# Patient Record
Sex: Male | Born: 1975
Health system: Southern US, Community
[De-identification: ages and names within clinical notes are randomized; demographics above are authoritative.]

## PROBLEM LIST (undated history)

## (undated) DIAGNOSIS — R0789 Other chest pain: Secondary | ICD-10-CM

## (undated) DIAGNOSIS — S82892A Other fracture of left lower leg, initial encounter for closed fracture: Secondary | ICD-10-CM

## (undated) HISTORY — PX: NO PAST SURGERIES: SHX2092

## (undated) HISTORY — DX: Other chest pain: R07.89

---

## 2016-01-13 ENCOUNTER — Emergency Department (HOSPITAL_COMMUNITY)
Admission: EM | Admit: 2016-01-13 | Discharge: 2016-01-14 | Disposition: A | Discharge status: Home / Self Care | Payer: BLUE CROSS/BLUE SHIELD | Attending: Emergency Medicine | Admitting: Emergency Medicine

## 2016-01-13 ENCOUNTER — Emergency Department (HOSPITAL_COMMUNITY): Payer: BLUE CROSS/BLUE SHIELD

## 2016-01-13 ENCOUNTER — Encounter (HOSPITAL_COMMUNITY): Payer: Self-pay | Admitting: Emergency Medicine

## 2016-01-13 DIAGNOSIS — S92142A Displaced dome fracture of left talus, initial encounter for closed fracture: Secondary | ICD-10-CM | POA: Diagnosis not present

## 2016-01-13 DIAGNOSIS — S8252XA Displaced fracture of medial malleolus of left tibia, initial encounter for closed fracture: Secondary | ICD-10-CM | POA: Diagnosis not present

## 2016-01-13 DIAGNOSIS — S29001A Unspecified injury of muscle and tendon of front wall of thorax, initial encounter: Secondary | ICD-10-CM | POA: Insufficient documentation

## 2016-01-13 DIAGNOSIS — Y9389 Activity, other specified: Secondary | ICD-10-CM | POA: Insufficient documentation

## 2016-01-13 DIAGNOSIS — S99912A Unspecified injury of left ankle, initial encounter: Secondary | ICD-10-CM | POA: Diagnosis present

## 2016-01-13 DIAGNOSIS — S80212A Abrasion, left knee, initial encounter: Secondary | ICD-10-CM | POA: Insufficient documentation

## 2016-01-13 DIAGNOSIS — T1490XA Injury, unspecified, initial encounter: Secondary | ICD-10-CM

## 2016-01-13 DIAGNOSIS — F17200 Nicotine dependence, unspecified, uncomplicated: Secondary | ICD-10-CM | POA: Diagnosis not present

## 2016-01-13 DIAGNOSIS — Y998 Other external cause status: Secondary | ICD-10-CM | POA: Diagnosis not present

## 2016-01-13 DIAGNOSIS — S0001XA Abrasion of scalp, initial encounter: Secondary | ICD-10-CM | POA: Diagnosis not present

## 2016-01-13 DIAGNOSIS — S8262XA Displaced fracture of lateral malleolus of left fibula, initial encounter for closed fracture: Secondary | ICD-10-CM | POA: Diagnosis not present

## 2016-01-13 DIAGNOSIS — S82892A Other fracture of left lower leg, initial encounter for closed fracture: Secondary | ICD-10-CM

## 2016-01-13 DIAGNOSIS — Q7290 Unspecified reduction defect of unspecified lower limb: Secondary | ICD-10-CM

## 2016-01-13 DIAGNOSIS — Y92410 Unspecified street and highway as the place of occurrence of the external cause: Secondary | ICD-10-CM | POA: Diagnosis not present

## 2016-01-13 MED ORDER — PROPOFOL 10 MG/ML IV BOLUS
0.5000 mg/kg | Freq: Once | INTRAVENOUS | Status: AC
  Administered 2016-01-14: 41.3 mg via INTRAVENOUS

## 2016-01-13 MED ORDER — PROPOFOL 10 MG/ML IV BOLUS
INTRAVENOUS | Status: AC
  Filled 2016-01-13: qty 20

## 2016-01-13 MED ORDER — HYDROMORPHONE HCL 1 MG/ML IJ SOLN
1.0000 mg | Freq: Once | INTRAMUSCULAR | Status: AC
  Administered 2016-01-13: 1 mg via INTRAVENOUS
  Filled 2016-01-13: qty 1

## 2016-01-13 NOTE — ED Notes (Signed)
Ortho tech in to apply short leg splint.  Pt tolerated well.  Friends at bedside.  Warm blanket given
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
## 2016-01-14 LAB — BASIC METABOLIC PANEL
Anion gap: 11 (ref 5–15)
BUN: 10 mg/dL (ref 6–20)
CALCIUM: 9.6 mg/dL (ref 8.9–10.3)
CHLORIDE: 104 mmol/L (ref 101–111)
CO2: 26 mmol/L (ref 22–32)
CREATININE: 0.76 mg/dL (ref 0.61–1.24)
GFR calc non Af Amer: >60 mL/min (ref >60)
Glucose, Bld: 123 mg/dL — ABNORMAL HIGH (ref 65–99)
Potassium: 3.7 mmol/L (ref 3.5–5.1)
SODIUM: 141 mmol/L (ref 135–145)

## 2016-01-14 LAB — CBC WITH DIFFERENTIAL/PLATELET
BASOS PCT: 0 %
Basophils Absolute: 0 10*3/uL (ref 0.0–0.1)
EOS ABS: 0 10*3/uL (ref 0.0–0.7)
EOS PCT: 0 %
HCT: 45.8 % (ref 39.0–52.0)
Hemoglobin: 15.7 g/dL (ref 13.0–17.0)
LYMPHS ABS: 0.7 10*3/uL (ref 0.7–4.0)
Lymphocytes Relative: 6 %
MCH: 28.6 pg (ref 26.0–34.0)
MCHC: 34.3 g/dL (ref 30.0–36.0)
MCV: 83.6 fL (ref 78.0–100.0)
Monocytes Absolute: 0.7 10*3/uL (ref 0.1–1.0)
Monocytes Relative: 7 %
Neutro Abs: 9.7 10*3/uL — ABNORMAL HIGH (ref 1.7–7.7)
Neutrophils Relative %: 87 %
PLATELETS: 240 10*3/uL (ref 150–400)
RBC: 5.48 MIL/uL (ref 4.22–5.81)
RDW: 13.2 % (ref 11.5–15.5)
WBC: 11.1 10*3/uL — AB (ref 4.0–10.5)

## 2016-01-14 MED ORDER — HYDROCODONE-ACETAMINOPHEN 5-325 MG PO TABS: 1.0000 | ORAL_TABLET | ORAL | Status: DC | PRN

## 2016-01-14 MED ORDER — PROPOFOL 10 MG/ML IV BOLUS
INTRAVENOUS | Status: AC | PRN
  Administered 2016-01-14: 40 mg via INTRAVENOUS

## 2016-01-14 MED ORDER — TETANUS-DIPHTH-ACELL PERTUSSIS 5-2.5-18.5 LF-MCG/0.5 IM SUSP
0.5000 mL | Freq: Once | INTRAMUSCULAR | Status: AC
  Administered 2016-01-14: 0.5 mL via INTRAMUSCULAR
  Filled 2016-01-14: qty 0.5

## 2016-01-14 MED ORDER — HYDROCODONE-ACETAMINOPHEN 5-325 MG PO TABS
2.0000 | ORAL_TABLET | Freq: Once | ORAL | Status: AC
  Administered 2016-01-14: 2 via ORAL
  Filled 2016-01-14: qty 2

## 2016-01-14 MED ORDER — PROPOFOL 10 MG/ML IV BOLUS
INTRAVENOUS | Status: AC
  Filled 2016-01-14: qty 20

## 2016-01-14 NOTE — Discharge Instructions (Signed)
°Cast or Splint Care  ° ° °Casts and splints support injured limbs and keep bones from moving while they heal. It is important to care for your cast or splint at home.  °HOME CARE INSTRUCTIONS  °Keep the cast or splint uncovered during the drying period. It can take 24 to 48 hours to dry if it is made of plaster. A fiberglass cast will dry in less than 1 hour.  °Do not rest the cast on anything harder than a pillow for the first 24 hours.  °Do not put weight on your injured limb or apply pressure to the cast until your health care provider gives you permission.  °Keep the cast or splint dry. Wet casts or splints can lose their shape and may not support the limb as well. A wet cast that has lost its shape can also create harmful pressure on your skin when it dries. Also, wet skin can become infected.  °Cover the cast or splint with a plastic bag when bathing or when out in the rain or snow. If the cast is on the trunk of the body, take sponge baths until the cast is removed.  °If your cast does become wet, dry it with a towel or a blow dryer on the cool setting only. °Keep your cast or splint clean. Soiled casts may be wiped with a moistened cloth.  °Do not place any hard or soft foreign objects under your cast or splint, such as cotton, toilet paper, lotion, or powder.  °Do not try to scratch the skin under the cast with any object. The object could get stuck inside the cast. Also, scratching could lead to an infection. If itching is a problem, use a blow dryer on a cool setting to relieve discomfort.  °Do not trim or cut your cast or remove padding from inside of it.  °Exercise all joints next to the injury that are not immobilized by the cast or splint. For example, if you have a long leg cast, exercise the hip joint and toes. If you have an arm cast or splint, exercise the shoulder, elbow, thumb, and fingers.  °Elevate your injured arm or leg on 1 or 2 pillows for the first 1 to 3 days to decrease swelling and  pain. It is best if you can comfortably elevate your cast so it is higher than your heart. °SEEK MEDICAL CARE IF:  °Your cast or splint cracks.  °Your cast or splint is too tight or too loose.  °You have unbearable itching inside the cast.  °Your cast becomes wet or develops a soft spot or area.  °You have a bad smell coming from inside your cast.  °You get an object stuck under your cast.  °Your skin around the cast becomes red or raw.  °You have new pain or worsening pain after the cast has been applied. °SEEK IMMEDIATE MEDICAL CARE IF:  °You have fluid leaking through the cast.  °You are unable to move your fingers or toes.  °You have discolored (blue or white), cool, painful, or very swollen fingers or toes beyond the cast.  °You have tingling or numbness around the injured area.  °You have severe pain or pressure under the cast.  °You have any difficulty with your breathing or have shortness of breath.  °You have chest pain. °This information is not intended to replace advice given to you by your health care provider. Make sure you discuss any questions you have with your health care provider.  °  Document Released: 08/15/2000 Document Revised: 06/08/2013 Document Reviewed: 02/24/2013  °Elsevier Interactive Patient Education ©2016 [employer]  ° °

## 2016-01-16 ENCOUNTER — Other Ambulatory Visit: Payer: Self-pay | Admitting: Physician Assistant

## 2016-01-16 ENCOUNTER — Encounter (HOSPITAL_COMMUNITY): Payer: Self-pay | Admitting: *Deleted

## 2016-01-18 ENCOUNTER — Ambulatory Visit (HOSPITAL_COMMUNITY): Payer: BLUE CROSS/BLUE SHIELD | Admitting: Anesthesiology

## 2016-01-18 ENCOUNTER — Encounter (HOSPITAL_COMMUNITY)
Admission: RE | Disposition: A | Discharge status: Home / Self Care | Payer: Self-pay | Source: Ambulatory Visit | Attending: Orthopaedic Surgery

## 2016-01-18 ENCOUNTER — Ambulatory Visit (HOSPITAL_COMMUNITY): Payer: BLUE CROSS/BLUE SHIELD

## 2016-01-18 ENCOUNTER — Observation Stay (HOSPITAL_COMMUNITY): Payer: BLUE CROSS/BLUE SHIELD

## 2016-01-18 ENCOUNTER — Observation Stay (HOSPITAL_COMMUNITY)
Admission: RE | Admit: 2016-01-18 | Discharge: 2016-01-20 | Disposition: A | Discharge status: Home / Self Care | Payer: BLUE CROSS/BLUE SHIELD | Source: Ambulatory Visit | Attending: Orthopaedic Surgery | Admitting: Orthopaedic Surgery

## 2016-01-18 ENCOUNTER — Encounter (HOSPITAL_COMMUNITY): Payer: Self-pay | Admitting: *Deleted

## 2016-01-18 DIAGNOSIS — S82842A Displaced bimalleolar fracture of left lower leg, initial encounter for closed fracture: Secondary | ICD-10-CM | POA: Diagnosis not present

## 2016-01-18 DIAGNOSIS — F17210 Nicotine dependence, cigarettes, uncomplicated: Secondary | ICD-10-CM | POA: Diagnosis not present

## 2016-01-18 DIAGNOSIS — T148XXA Other injury of unspecified body region, initial encounter: Secondary | ICD-10-CM

## 2016-01-18 HISTORY — PX: ORIF ANKLE FRACTURE: SHX5408

## 2016-01-18 HISTORY — DX: Other fracture of left lower leg, initial encounter for closed fracture: S82.892A

## 2016-01-18 SURGERY — OPEN REDUCTION INTERNAL FIXATION (ORIF) ANKLE FRACTURE
Anesthesia: General | Site: Ankle | Laterality: Left

## 2016-01-18 MED ORDER — METHOCARBAMOL 500 MG PO TABS
500.0000 mg | ORAL_TABLET | Freq: Four times a day (QID) | ORAL | Status: DC | PRN
  Administered 2016-01-18 – 2016-01-20 (×3): 500 mg via ORAL
  Filled 2016-01-18 (×3): qty 1

## 2016-01-18 MED ORDER — HYDROMORPHONE HCL 1 MG/ML IJ SOLN
INTRAMUSCULAR | Status: AC
  Filled 2016-01-18: qty 1

## 2016-01-18 MED ORDER — METOCLOPRAMIDE HCL 5 MG/ML IJ SOLN: 5.0000 mg | Freq: Three times a day (TID) | INTRAMUSCULAR | Status: DC | PRN

## 2016-01-18 MED ORDER — BUPIVACAINE HCL (PF) 0.5 % IJ SOLN
INTRAMUSCULAR | Status: AC
  Filled 2016-01-18: qty 30

## 2016-01-18 MED ORDER — SODIUM CHLORIDE 0.9 % IV SOLN
INTRAVENOUS | Status: DC
  Administered 2016-01-18: 18:00:00 via INTRAVENOUS

## 2016-01-18 MED ORDER — ONDANSETRON HCL 4 MG PO TABS: 4.0000 mg | ORAL_TABLET | Freq: Four times a day (QID) | ORAL | Status: DC | PRN

## 2016-01-18 MED ORDER — CEFAZOLIN SODIUM-DEXTROSE 2-4 GM/100ML-% IV SOLN
INTRAVENOUS | Status: AC
  Filled 2016-01-18: qty 100

## 2016-01-18 MED ORDER — DIPHENHYDRAMINE HCL 12.5 MG/5ML PO ELIX: 12.5000 mg | ORAL_SOLUTION | ORAL | Status: DC | PRN

## 2016-01-18 MED ORDER — LIDOCAINE HCL (CARDIAC) 20 MG/ML IV SOLN
INTRAVENOUS | Status: AC
  Filled 2016-01-18: qty 5

## 2016-01-18 MED ORDER — CETYLPYRIDINIUM CHLORIDE 0.05 % MT LIQD
7.0000 mL | Freq: Two times a day (BID) | OROMUCOSAL | Status: DC
  Administered 2016-01-18 – 2016-01-20 (×3): 7 mL via OROMUCOSAL

## 2016-01-18 MED ORDER — HYDROMORPHONE HCL 1 MG/ML IJ SOLN
1.0000 mg | INTRAMUSCULAR | Status: DC | PRN
  Administered 2016-01-18: 1 mg via INTRAVENOUS
  Filled 2016-01-18: qty 1

## 2016-01-18 MED ORDER — FENTANYL CITRATE (PF) 250 MCG/5ML IJ SOLN
INTRAMUSCULAR | Status: AC
Start: 2016-01-18 — End: 2016-01-18
  Filled 2016-01-18: qty 5

## 2016-01-18 MED ORDER — LIDOCAINE HCL (CARDIAC) 20 MG/ML IV SOLN
INTRAVENOUS | Status: DC | PRN
  Administered 2016-01-18: 100 mg via INTRAVENOUS

## 2016-01-18 MED ORDER — MIDAZOLAM HCL 2 MG/2ML IJ SOLN
INTRAMUSCULAR | Status: AC
  Filled 2016-01-18: qty 2

## 2016-01-18 MED ORDER — SUCCINYLCHOLINE CHLORIDE 20 MG/ML IJ SOLN
INTRAMUSCULAR | Status: DC | PRN
  Administered 2016-01-18: 100 mg via INTRAVENOUS

## 2016-01-18 MED ORDER — HYDROMORPHONE HCL 1 MG/ML IJ SOLN
0.2500 mg | INTRAMUSCULAR | Status: DC | PRN
  Administered 2016-01-18 (×6): 0.5 mg via INTRAVENOUS

## 2016-01-18 MED ORDER — METHOCARBAMOL 1000 MG/10ML IJ SOLN
500.0000 mg | Freq: Four times a day (QID) | INTRAVENOUS | Status: DC | PRN
  Administered 2016-01-18: 500 mg via INTRAVENOUS
  Filled 2016-01-18: qty 5
  Filled 2016-01-18: qty 550

## 2016-01-18 MED ORDER — ACETAMINOPHEN 325 MG PO TABS
650.0000 mg | ORAL_TABLET | Freq: Four times a day (QID) | ORAL | Status: DC | PRN
  Administered 2016-01-18: 650 mg via ORAL
  Filled 2016-01-18: qty 2

## 2016-01-18 MED ORDER — METOCLOPRAMIDE HCL 5 MG PO TABS: 5.0000 mg | ORAL_TABLET | Freq: Three times a day (TID) | ORAL | Status: DC | PRN

## 2016-01-18 MED ORDER — CEFAZOLIN SODIUM-DEXTROSE 2-4 GM/100ML-% IV SOLN
2.0000 g | INTRAVENOUS | Status: AC
  Administered 2016-01-18: 2 g via INTRAVENOUS
  Filled 2016-01-18: qty 100

## 2016-01-18 MED ORDER — ONDANSETRON HCL 4 MG/2ML IJ SOLN
INTRAMUSCULAR | Status: DC | PRN
  Administered 2016-01-18: 4 mg via INTRAVENOUS

## 2016-01-18 MED ORDER — ACETAMINOPHEN 10 MG/ML IV SOLN
INTRAVENOUS | Status: DC | PRN
  Administered 2016-01-18: 1000 mg via INTRAVENOUS

## 2016-01-18 MED ORDER — ONDANSETRON HCL 4 MG/2ML IJ SOLN
INTRAMUSCULAR | Status: AC
  Filled 2016-01-18: qty 2

## 2016-01-18 MED ORDER — ONDANSETRON HCL 4 MG/2ML IJ SOLN: 4.0000 mg | Freq: Four times a day (QID) | INTRAMUSCULAR | Status: DC | PRN

## 2016-01-18 MED ORDER — ACETAMINOPHEN 10 MG/ML IV SOLN
INTRAVENOUS | Status: AC
  Filled 2016-01-18: qty 100

## 2016-01-18 MED ORDER — CHLORHEXIDINE GLUCONATE 4 % EX LIQD: 60.0000 mL | Freq: Once | CUTANEOUS | Status: DC

## 2016-01-18 MED ORDER — CEFAZOLIN SODIUM 1-5 GM-% IV SOLN
1.0000 g | Freq: Four times a day (QID) | INTRAVENOUS | Status: AC
  Administered 2016-01-18 – 2016-01-19 (×3): 1 g via INTRAVENOUS
  Filled 2016-01-18 (×3): qty 50

## 2016-01-18 MED ORDER — PROPOFOL 10 MG/ML IV BOLUS
INTRAVENOUS | Status: AC
  Filled 2016-01-18: qty 20

## 2016-01-18 MED ORDER — FENTANYL CITRATE (PF) 100 MCG/2ML IJ SOLN
INTRAMUSCULAR | Status: DC | PRN
  Administered 2016-01-18 (×5): 50 ug via INTRAVENOUS

## 2016-01-18 MED ORDER — MIDAZOLAM HCL 5 MG/5ML IJ SOLN
INTRAMUSCULAR | Status: DC | PRN
  Administered 2016-01-18 (×2): 1 mg via INTRAVENOUS

## 2016-01-18 MED ORDER — LACTATED RINGERS IV SOLN
INTRAVENOUS | Status: DC | PRN
  Administered 2016-01-18: 13:00:00 via INTRAVENOUS

## 2016-01-18 MED ORDER — PROPOFOL 10 MG/ML IV BOLUS
INTRAVENOUS | Status: DC | PRN
  Administered 2016-01-18: 180 mg via INTRAVENOUS

## 2016-01-18 MED ORDER — ACETAMINOPHEN 650 MG RE SUPP: 650.0000 mg | Freq: Four times a day (QID) | RECTAL | Status: DC | PRN

## 2016-01-18 MED ORDER — OXYCODONE HCL 5 MG PO TABS
5.0000 mg | ORAL_TABLET | ORAL | Status: DC | PRN
  Administered 2016-01-18 – 2016-01-20 (×5): 10 mg via ORAL
  Filled 2016-01-18 (×5): qty 2

## 2016-01-18 SURGICAL SUPPLY — 44 items
BANDAGE ELASTIC 4 VELCRO ST LF (GAUZE/BANDAGES/DRESSINGS) ×2 IMPLANT
CUFF TOURN SGL QUICK 34 (TOURNIQUET CUFF) ×1
CUFF TRNQT CYL 34X4X40X1 (TOURNIQUET CUFF) ×1 IMPLANT
DECANTER SPIKE VIAL GLASS SM (MISCELLANEOUS) ×2 IMPLANT
DRAPE C-ARM 42X120 X-RAY (DRAPES) ×2 IMPLANT
DRAPE U-SHAPE 47X51 STRL (DRAPES) ×2 IMPLANT
DRSG ADAPTIC 3X8 NADH LF (GAUZE/BANDAGES/DRESSINGS) IMPLANT
DRSG PAD ABDOMINAL 8X10 ST (GAUZE/BANDAGES/DRESSINGS) IMPLANT
DURAPREP 26ML APPLICATOR (WOUND CARE) ×2 IMPLANT
ELECT REM PT RETURN 9FT ADLT (ELECTROSURGICAL) ×2
ELECTRODE REM PT RTRN 9FT ADLT (ELECTROSURGICAL) ×1 IMPLANT
GAUZE SPONGE 4X4 12PLY STRL (GAUZE/BANDAGES/DRESSINGS) ×2 IMPLANT
GAUZE XEROFORM 5X9 LF (GAUZE/BANDAGES/DRESSINGS) ×2 IMPLANT
GLOVE BIO SURGEON STRL SZ7.5 (GLOVE) ×2 IMPLANT
GLOVE BIOGEL PI IND STRL 8 (GLOVE) ×2 IMPLANT
GLOVE BIOGEL PI INDICATOR 8 (GLOVE) ×2
GLOVE ECLIPSE 8.0 STRL XLNG CF (GLOVE) ×2 IMPLANT
GOWN STRL REUS W/TWL XL LVL3 (GOWN DISPOSABLE) ×4 IMPLANT
KIT BASIN OR (CUSTOM PROCEDURE TRAY) ×2 IMPLANT
NEEDLE HYPO 22GX1.5 SAFETY (NEEDLE) ×2 IMPLANT
NS IRRIG 1000ML POUR BTL (IV SOLUTION) ×2 IMPLANT
PACK TOTAL JOINT (CUSTOM PROCEDURE TRAY) ×2 IMPLANT
PAD ABD 8X10 STRL (GAUZE/BANDAGES/DRESSINGS) ×2 IMPLANT
PAD CAST 4YDX4 CTTN HI CHSV (CAST SUPPLIES) IMPLANT
PADDING CAST COTTON 4X4 STRL (CAST SUPPLIES)
PADDING CAST COTTON 6X4 STRL (CAST SUPPLIES) ×2 IMPLANT
PIN APEX 4X90MM EXFIX (EXFIX) ×4 IMPLANT
PIN APEX 5X150 (EXFIX) ×4 IMPLANT
PIN CLAMP 5H 30DEG POST (EXFIX) ×2 IMPLANT
PIN TO ROD COUPLING EXFIX (EXFIX) ×8 IMPLANT
PIN TO ROD COUPLING INVERT (EXFIX) ×8 IMPLANT
PIN TRANSFIXING EXFIX (EXFIX) ×2 IMPLANT
POSITIONER SURGICAL ARM (MISCELLANEOUS) ×2 IMPLANT
ROD CARBON (EXFIX) ×2
ROD CNCT 400X11XNS LF HFMN (EXFIX) ×2 IMPLANT
ROD HOFFMANN3 CONNECT 11X350 (EXFIX) ×4 IMPLANT
ROD SEMI CIRCULAR EXFIX (EXFIX) ×2 IMPLANT
ROD TO ROD COUPLING EXFIX (EXFIX) ×4 IMPLANT
SUT ETHILON 4 0 PS 2 18 (SUTURE) ×4 IMPLANT
SUT VIC AB 2-0 CT1 27 (SUTURE) ×1
SUT VIC AB 2-0 CT1 TAPERPNT 27 (SUTURE) ×1 IMPLANT
SYR CONTROL 10ML LL (SYRINGE) ×2 IMPLANT
TOWEL OR 17X26 10 PK STRL BLUE (TOWEL DISPOSABLE) ×4 IMPLANT
TOWEL OR NON WOVEN STRL DISP B (DISPOSABLE) ×2 IMPLANT

## 2016-01-18 NOTE — Anesthesia Procedure Notes (Signed)
Procedure Name: Intubation Date/Time: 01/18/2016 1:42 PM Performed by: Jarvis NewcomerARMISTEAD, Chiara Coltrin A Pre-anesthesia Checklist: Patient identified, Timeout performed, Emergency Drugs available, Suction available and Patient being monitored Patient Re-evaluated:Patient Re-evaluated prior to inductionOxygen Delivery Method: Circle system utilized Preoxygenation: Pre-oxygenation with 100% oxygen Intubation Type: IV induction Ventilation: Mask ventilation without difficulty Laryngoscope Size: Mac and 4 Grade View: Grade II Tube type: Oral Tube size: 7.5 mm Number of attempts: 2 Airway Equipment and Method: Stylet Placement Confirmation: breath sounds checked- equal and bilateral,  ETT inserted through vocal cords under direct vision and positive ETCO2 Secured at: 22 cm Tube secured with: Tape Dental Injury: Teeth and Oropharynx as per pre-operative assessment
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
## 2016-01-18 NOTE — Anesthesia Postprocedure Evaluation (Signed)
Anesthesia Post Note  Patient: Charles Hines  Procedure(s) Performed: Procedure(s) (LRB):  EXTERNAL FIXATION LEFT  UNSTABLE BIMALLEOLAR ANKLE FRACTURE (Left)  Patient location during evaluation: PACU Anesthesia Type: General Level of consciousness: awake Pain management: pain level controlled Vital Signs Assessment: post-procedure vital signs reviewed and stable Respiratory status: spontaneous breathing Cardiovascular status: stable Anesthetic complications: no    Last Vitals:  Filed Vitals:   01/18/16 1113  BP: 142/91  Pulse: 107  Temp: 37.2 C  Resp: 18    Last Pain:  Filed Vitals:   01/18/16 1146  PainSc: 3                  EDWARDS,Miroslava Santellan
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
## 2016-01-18 NOTE — Anesthesia Preprocedure Evaluation (Addendum)
Anesthesia Evaluation  Patient identified by MRN, date of birth, ID band Patient awake    Reviewed: Allergy & Precautions, NPO status , Patient's Chart, lab work & pertinent test results  Airway Mallampati: II  TM Distance: >3 FB Neck ROM: Full    Dental   Pulmonary Current Smoker,  breath sounds clear to auscultation        Cardiovascular negative cardio ROS  Rhythm:Regular Rate:Normal     Neuro/Psych    GI/Hepatic negative GI ROS, Neg liver ROS,   Endo/Other  negative endocrine ROS  Renal/GU negative Renal ROS     Musculoskeletal   Abdominal   Peds  Hematology   Anesthesia Other Findings   Reproductive/Obstetrics                             Anesthesia Physical Anesthesia Plan  ASA: II  Anesthesia Plan: General   Post-op Pain Management:    Induction: Intravenous  Airway Management Planned: Oral ETT  Additional Equipment:   Intra-op Plan:   Post-operative Plan: Extubation in OR  Informed Consent: I have reviewed the patients History and Physical, chart, labs and discussed the procedure including the risks, benefits and alternatives for the proposed anesthesia with the patient or authorized representative who has indicated his/her understanding and acceptance.   Dental advisory given  Plan Discussed with: CRNA and Anesthesiologist  Anesthesia Plan Comments:         Anesthesia Quick Evaluation  

## 2016-01-18 NOTE — Transfer of Care (Signed)
Immediate Anesthesia Transfer of Care Note  Patient: Charles Hines  Procedure(s) Performed: Procedure(s):  EXTERNAL FIXATION LEFT  UNSTABLE BIMALLEOLAR ANKLE FRACTURE (Left)  Patient Location: PACU  Anesthesia Type:General  Level of Consciousness: awake, alert , oriented and patient cooperative  Airway & Oxygen Therapy: Patient Spontanous Breathing and Patient connected to face mask oxygen  Post-op Assessment: Report given to RN, Post -op Vital signs reviewed and stable and Patient moving all extremities  Post vital signs: Reviewed and stable  Last Vitals:  Filed Vitals:   01/18/16 1113  BP: 142/91  Pulse: 107  Temp: 37.2 C  Resp: 18    Last Pain:  Filed Vitals:   01/18/16 1146  PainSc: 3       Patients Stated Pain Goal: 4 (01/18/16 1128)  Complications: No apparent anesthesia complications

## 2016-01-19 DIAGNOSIS — S82842A Displaced bimalleolar fracture of left lower leg, initial encounter for closed fracture: Secondary | ICD-10-CM | POA: Diagnosis not present

## 2016-01-19 MED ORDER — OXYCODONE-ACETAMINOPHEN 5-325 MG PO TABS: 1.0000 | ORAL_TABLET | ORAL | Status: DC | PRN

## 2016-01-19 MED ORDER — CEPHALEXIN 500 MG PO CAPS: 500.0000 mg | ORAL_CAPSULE | Freq: Two times a day (BID) | ORAL | Status: DC

## 2016-01-19 MED ORDER — ASPIRIN EC 325 MG PO TBEC: 325.0000 mg | DELAYED_RELEASE_TABLET | Freq: Two times a day (BID) | ORAL | Status: DC
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
## 2016-01-19 NOTE — Progress Notes (Signed)
Physical Therapy Treatment Patient Details Name: Charles Hines MRN: [id number] DOB: 1976/07/09 Today's Date: 01/19/2016    History of Present Illness s/p external fixation L bimalleolar ankle fx    PT Comments    Pt progressing; has not taken any pain meds since early am; pt reports pain has elevated this pm, RN notified for meds; may benefif  from staying another night to get pain controlled and work with PT in am;  Lengthy discussion regarding ice, elevation and  ex fix and how it is stabilizing the fxs; pt does well maintaining NWB  Follow Up Recommendations  No PT follow up     Equipment Recommendations  Rolling walker with 5" wheels    Recommendations for Other Services       Precautions / Restrictions Precautions Precautions: Fall Restrictions LLE Weight Bearing: Non weight bearing    Mobility  Bed Mobility                  Transfers Overall transfer level: Needs assistance Equipment used: Rolling walker (2 wheeled) Transfers: Sit to/from Stand Sit to Stand: Min guard;Supervision         General transfer comment: multi-modal cues for hand placement and safety  Ambulation/Gait Ambulation/Gait assistance: Min guard Ambulation Distance (Feet): 120 Feet (10' with crutches) Assistive device: Rolling walker (2 wheeled)       General Gait Details: cues for sequence and RW position   Stairs Stairs: Yes Stairs assistance: Min assist Stair Management: One rail Right;Step to pattern;With crutches Number of Stairs: 4 General stair comments: cues for sequence  Wheelchair Mobility    Modified Rankin (Stroke Patients Only)       Balance                                    Cognition Arousal/Alertness: Awake/alert Behavior During Therapy: WFL for tasks assessed/performed Overall Cognitive Status: Within Functional Limits for tasks assessed                      Exercises General Exercises - Lower Extremity Heel  Slides: 10 reps;AAROM;Left    General Comments        Pertinent Vitals/Pain Pain Assessment: 0-10 Pain Score: 7  Pain Location: L ankle Pain Descriptors / Indicators: Sore;Guarding;Grimacing Pain Intervention(s): Limited activity within patient's tolerance;Monitored during session;Patient requesting pain meds-RN notified    Home Living                      Prior Function            PT Goals (current goals can now be found in the care plan section) Acute Rehab PT Goals Patient Stated Goal: home soon PT Goal Formulation: With patient Time For Goal Achievement: 01/22/16 Potential to Achieve Goals: Good Progress towards PT goals: Progressing toward goals    Frequency  7X/week    PT Plan Current plan remains appropriate    Co-evaluation             End of Session Equipment Utilized During Treatment: Gait belt Activity Tolerance: Patient tolerated treatment well Patient left: with family/visitor present;in bed;with bed alarm set     Time: 1359-1454 PT Time Calculation (min) (ACUTE ONLY): 55 min  Charges:  $Gait Training: 38-52 mins $Therapeutic Activity: 8-22 mins                    G  Codes:  Functional Assessment Tool Used: clinical judgement Functional Limitation: Mobility: Walking and moving around Mobility: Walking and Moving Around Current Status [phone](G8978): At least 1 percent but less than 20 percent impaired, limited or restricted Mobility: Walking and Moving Around Goal Status [phone](G8979): At least 1 percent but less than 20 percent impaired, limited or restricted   [facility] HospitalWILLIAMS,Charles Hyden 01/19/2016, 3:59 PM

## 2016-01-19 NOTE — Evaluation (Signed)
Physical Therapy Evaluation Patient Details Name: Charles Hines MRN: [id number] DOB: 11/02/1975 Today's Date: 01/19/2016   History of Present Illness  s/p external fixation L bimalleolar ankle fx  Clinical Impression  Pt admitted with above diagnosis. Pt currently with functional limitations due to the deficits listed below (see PT Problem List). * Pt will benefit from skilled PT to increase their independence and safety with mobility to allow discharge to the venue listed below.   Will see for second session for stair training; (pt has been hopping up flight of stairs with friends assist)     Follow Up Recommendations No PT follow up    Equipment Recommendations  None recommended by PT    Recommendations for Other Services       Precautions / Restrictions Precautions Precautions: Fall Restrictions LLE Weight Bearing: Non weight bearing      Mobility  Bed Mobility Overal bed mobility: Needs Assistance Bed Mobility: Supine to Sit     Supine to sit: Min guard     General bed mobility comments: fro LLE  Transfers Overall transfer level: Needs assistance Equipment used: Rolling walker (2 wheeled) Transfers: Sit to/from Stand Sit to Stand: Min guard         General transfer comment: multi-modal cues for hand placement and safety  Ambulation/Gait Ambulation/Gait assistance: Min guard Ambulation Distance (Feet): 50 Feet Assistive device: Rolling walker (2 wheeled)       General Gait Details: cues for sequence and RW position  Stairs            Wheelchair Mobility    Modified Rankin (Stroke Patients Only)       Balance Overall balance assessment: Needs assistance   Sitting balance-Leahy Scale: Fair       Standing balance-Leahy Scale: Poor Standing balance comment: reliant on UEs                             Pertinent Vitals/Pain Pain Assessment: 0-10 Pain Score: 2  Pain Location: L ankle Pain Descriptors / Indicators:  Aching Pain Intervention(s): Limited activity within patient's tolerance;Monitored during session;Repositioned;Ice applied    Home Living Family/patient expects to be discharged to:: Private residence Living Arrangements: Non-relatives/Friends   Type of Home: House       Home Layout: Two level Home Equipment: Environmental consultantWalker - 2 wheels      Prior Function Level of Independence: Independent               Hand Dominance        Extremity/Trunk Assessment   Upper Extremity Assessment: Overall WFL for tasks assessed           Lower Extremity Assessment: LLE deficits/detail   LLE Deficits / Details: AAROM knee and hip grossly WFl; ankle NT--ex-fix     Communication   Communication: No difficulties  Cognition Arousal/Alertness: Awake/alert Behavior During Therapy: WFL for tasks assessed/performed Overall Cognitive Status: Within Functional Limits for tasks assessed                      General Comments      Exercises        Assessment/Plan    PT Assessment Patient needs continued PT services  PT Diagnosis Difficulty walking   PT Problem List Decreased activity tolerance;Decreased mobility;Decreased knowledge of precautions;Decreased knowledge of use of DME;Pain  PT Treatment Interventions DME instruction;Gait training;Stair training;Functional mobility training;Therapeutic activities;Therapeutic exercise;Patient/family education   PT Goals (Current goals can  be found in the Care Plan section) Acute Rehab PT Goals Patient Stated Goal: home sson PT Goal Formulation: With patient Time For Goal Achievement: 01/22/16 Potential to Achieve Goals: Good    Frequency 7X/week   Barriers to discharge        Co-evaluation               End of Session Equipment Utilized During Treatment: Gait belt Activity Tolerance: Patient tolerated treatment well Patient left: in chair;with call bell/phone within reach;with chair alarm set;with family/visitor  present           Time: 1610-9604 PT Time Calculation (min) (ACUTE ONLY): 25 min   Charges:   PT Evaluation $PT Eval Low Complexity: 1 Procedure PT Treatments $Gait Training: 8-22 mins   PT G Codes:        Reve Crocket 2016-01-21, 9:36 AM
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
## 2016-01-19 NOTE — Progress Notes (Signed)
Subjective: 1 Day Post-Op Procedure(s) (LRB):  EXTERNAL FIXATION LEFT  UNSTABLE BIMALLEOLAR ANKLE FRACTURE (Left) Patient reports pain as moderate.    Objective: Vital signs in last 24 hours: Temp:  [98.1 F (36.7 C)-101.9 F (38.8 C)] 99.6 F (37.6 C) (05/20 0617) Pulse Rate:  [63-107] 83 (05/20 0617) Resp:  [13-18] 16 (05/20 0617) BP: (141-182)/(82-107) 152/83 mmHg (05/20 0617) SpO2:  [98 %-100 %] 98 % (05/20 0617) Weight:  [75.751 kg (167 lb)] 75.751 kg (167 lb) (05/19 1145)  Intake/Output from previous day: 05/19 0701 - 05/20 0700 In: 3903.3 [P.O.:1680; I.V.:2223.3] Out: 1810 [Urine:1800; Blood:10] Intake/Output this shift:    No results for input(s): HGB in the last 72 hours. No results for input(s): WBC, RBC, HCT, PLT in the last 72 hours. No results for input(s): NA, K, CL, CO2, BUN, CREATININE, GLUCOSE, CALCIUM in the last 72 hours. No results for input(s): LABPT, INR in the last 72 hours.  Sensation intact distally Intact pulses distally Incision: dressing C/D/I Compartment soft  Assessment/Plan: 1 Day Post-Op Procedure(s) (LRB):  EXTERNAL FIXATION LEFT  UNSTABLE BIMALLEOLAR ANKLE FRACTURE (Left) Up with therapy - NWB left ankle Ice and elevation Discharge to home today if clears therapy vs tomorrow  Charles Hines,Charles Hines 01/19/2016, 10:29 AM

## 2016-01-19 NOTE — Discharge Instructions (Signed)
Keep all of your weight off of your left leg. Keep your dressings clean and dry. Ice and elevation as needed periodically throughout the day.

## 2016-01-20 DIAGNOSIS — S82842A Displaced bimalleolar fracture of left lower leg, initial encounter for closed fracture: Secondary | ICD-10-CM | POA: Diagnosis not present

## 2016-01-20 NOTE — Progress Notes (Signed)
Pt explained and given dc instructions; interpreter service utilized. Pt verbalized understanding. Pt safely placed in wheelchair for transport to lobby.  Pt escorted to lobby via nursing tech and accompanied by two friends. Discharging to home

## 2016-01-20 NOTE — Progress Notes (Signed)
Patient ID: Charles Hines, male   DOB: 05/10/1976, 40 y.o.   MRN: [id number] Patient ambulating independently in the hall with a walker nonweightbearing on the left lower extremity. Plan for discharge to home today. Prescriptions provided follow-up in the office with Dr. Magnus IvanBlackman next week.

## 2016-01-20 NOTE — Progress Notes (Signed)
Physical Therapy Treatment Patient Details Name: Charles Hines MRN: [id number] DOB: 1976-02-16 Today's Date: 01/20/2016    History of Present Illness s/p external fixation L bimalleolar ankle fx    PT Comments    Pt feels better today, pain controlled; pt and friend express readiness to go home; all questions answered; discussed proper elevation of LLE at home, continued awareness of exfix (size) during mobility  Follow Up Recommendations  No PT follow up     Equipment Recommendations  Rolling walker with 5" wheels    Recommendations for Other Services       Precautions / Restrictions Precautions Precautions: Fall Restrictions LLE Weight Bearing: Non weight bearing Other Position/Activity Restrictions: discussed LLE elevation without pressure on posterior bar, ice as needed    Mobility  Bed Mobility Overal bed mobility: Needs Assistance Bed Mobility: Sit to Supine       Sit to supine: Min guard   General bed mobility comments: for LLE  Transfers Overall transfer level: Modified independent Equipment used: Rolling walker (2 wheeled) Transfers: Sit to/from Stand Sit to Stand: Modified independent (Device/Increase time)         General transfer comment: pt self corrects for hand placement  Ambulation/Gait Ambulation/Gait assistance: Supervision;Min guard Ambulation Distance (Feet): 130 Feet Assistive device: Rolling walker (2 wheeled)       General Gait Details: cues for RW distance from self   Stairs         General stair comments:  (verbalizes correct technique)  Wheelchair Mobility    Modified Rankin (Stroke Patients Only)       Balance             Standing balance-Leahy Scale: Poor Standing balance comment: reliant on UEs for safe standing                    Cognition Arousal/Alertness: Awake/alert Behavior During Therapy: WFL for tasks assessed/performed Overall Cognitive Status: Within Functional Limits for tasks  assessed                      Exercises      General Comments        Pertinent Vitals/Pain Pain Assessment: 0-10 Pain Score: 2  Pain Location: L ankle Pain Descriptors / Indicators: Pressure Pain Intervention(s): Limited activity within patient's tolerance;Monitored during session;Other (comment) (LLE elevation)    Home Living                      Prior Function            PT Goals (current goals can now be found in the care plan section) Acute Rehab PT Goals Patient Stated Goal: home today PT Goal Formulation: With patient Time For Goal Achievement: 01/22/16 Potential to Achieve Goals: Good Progress towards PT goals: Progressing toward goals    Frequency       PT Plan Current plan remains appropriate    Co-evaluation             End of Session   Activity Tolerance: Patient tolerated treatment well Patient left: with family/visitor present;in bed;with bed alarm set     Time: 4098-1191 PT Time Calculation (min) (ACUTE ONLY): 23 min  Charges:  $Gait Training: 23-37 mins                    G Codes:  Functional Assessment Tool Used: clinical judgement Mobility: Walking and Moving Around Goal Status (Y7829): At least 1 percent  but less than 20 percent impaired, limited or restricted Mobility: Walking and Moving Around Discharge Status [phone]([id number]): At least 1 percent but less than 20 percent impaired, limited or restricted   [facility]WILLIAMS,Charles Rini 01/20/2016, 10:17 AM

## 2016-01-20 NOTE — Care Management Note (Signed)
Case Management Note  Patient Details  Name: Antonieta Lovelessbdalhakam Flannigan MRN: [id number] Date of Birth: 04/18/1976  Subjective/Objective:                  s/p external fixation L bimalleolar ankle fx  Action/Plan: CM spoke with patient. Patient needs a RW for home. [facility] request and d/c date of today and will deliver the RW to patient's room.   Expected Discharge Date:   01/20/16               Expected Discharge Plan:  Home/Self Care  In-House Referral:     Discharge planning Services  CM Consult  Post Acute Care Choice:    Choice offered to:     DME Arranged:  Walker rolling DME Agency:  [facility]  [facility]:     Status of Service:  Completed, signed off  Medicare Important Message Given:    Date Medicare IM Given:    Medicare IM give by:    Date Additional Medicare IM Given:    Additional Medicare Important Message give by:     If discussed at Long Length of Stay Meetings, dates discussed:    Additional Comments:  Antony HasteBennett, Kallum Jorgensen Harris, RN 01/20/2016, 9:07 AM

## 2016-01-21 ENCOUNTER — Encounter (HOSPITAL_COMMUNITY): Payer: Self-pay | Admitting: Orthopaedic Surgery

## 2016-01-24 ENCOUNTER — Encounter (HOSPITAL_COMMUNITY): Payer: Self-pay | Admitting: *Deleted

## 2016-01-24 ENCOUNTER — Other Ambulatory Visit: Payer: Self-pay | Admitting: Physician Assistant

## 2016-02-01 ENCOUNTER — Encounter (HOSPITAL_COMMUNITY): Payer: Self-pay | Admitting: *Deleted

## 2016-02-01 ENCOUNTER — Ambulatory Visit (HOSPITAL_COMMUNITY): Payer: BLUE CROSS/BLUE SHIELD

## 2016-02-01 ENCOUNTER — Ambulatory Visit (HOSPITAL_COMMUNITY): Payer: BLUE CROSS/BLUE SHIELD | Admitting: Anesthesiology

## 2016-02-01 ENCOUNTER — Ambulatory Visit (HOSPITAL_COMMUNITY)
Admission: RE | Admit: 2016-02-01 | Discharge: 2016-02-02 | Disposition: A | Discharge status: Home / Self Care | Payer: BLUE CROSS/BLUE SHIELD | Source: Ambulatory Visit | Attending: Orthopaedic Surgery | Admitting: Orthopaedic Surgery

## 2016-02-01 ENCOUNTER — Encounter (HOSPITAL_COMMUNITY)
Admission: RE | Disposition: A | Discharge status: Home / Self Care | Payer: Self-pay | Source: Ambulatory Visit | Attending: Orthopaedic Surgery

## 2016-02-01 DIAGNOSIS — Z87891 Personal history of nicotine dependence: Secondary | ICD-10-CM | POA: Diagnosis not present

## 2016-02-01 DIAGNOSIS — S82842A Displaced bimalleolar fracture of left lower leg, initial encounter for closed fracture: Secondary | ICD-10-CM | POA: Diagnosis present

## 2016-02-01 DIAGNOSIS — S82843A Displaced bimalleolar fracture of unspecified lower leg, initial encounter for closed fracture: Secondary | ICD-10-CM | POA: Diagnosis present

## 2016-02-01 DIAGNOSIS — S9305XA Dislocation of left ankle joint, initial encounter: Secondary | ICD-10-CM | POA: Diagnosis not present

## 2016-02-01 DIAGNOSIS — Z419 Encounter for procedure for purposes other than remedying health state, unspecified: Secondary | ICD-10-CM

## 2016-02-01 HISTORY — PX: ORIF ANKLE FRACTURE: SHX5408

## 2016-02-01 LAB — CBC
HCT: 37.9 % — ABNORMAL LOW (ref 39.0–52.0)
HEMOGLOBIN: 12.9 g/dL — AB (ref 13.0–17.0)
MCH: 28.1 pg (ref 26.0–34.0)
MCHC: 34 g/dL (ref 30.0–36.0)
MCV: 82.6 fL (ref 78.0–100.0)
PLATELETS: 414 10*3/uL — AB (ref 150–400)
RBC: 4.59 MIL/uL (ref 4.22–5.81)
RDW: 13.2 % (ref 11.5–15.5)
WBC: 3.8 10*3/uL — ABNORMAL LOW (ref 4.0–10.5)

## 2016-02-01 SURGERY — OPEN REDUCTION INTERNAL FIXATION (ORIF) ANKLE FRACTURE
Anesthesia: General | Site: Leg Lower | Laterality: Left

## 2016-02-01 MED ORDER — FENTANYL CITRATE (PF) 100 MCG/2ML IJ SOLN
INTRAMUSCULAR | Status: DC | PRN
  Administered 2016-02-01: 25 ug via INTRAVENOUS
  Administered 2016-02-01: 50 ug via INTRAVENOUS
  Administered 2016-02-01 (×3): 25 ug via INTRAVENOUS

## 2016-02-01 MED ORDER — FENTANYL CITRATE (PF) 100 MCG/2ML IJ SOLN
50.0000 ug | Freq: Once | INTRAMUSCULAR | Status: AC
  Administered 2016-02-01: 50 ug via INTRAVENOUS

## 2016-02-01 MED ORDER — SODIUM CHLORIDE 0.9 % IR SOLN
Status: AC
  Filled 2016-02-01: qty 1

## 2016-02-01 MED ORDER — ONDANSETRON HCL 4 MG/2ML IJ SOLN: 4.0000 mg | Freq: Four times a day (QID) | INTRAMUSCULAR | Status: DC | PRN

## 2016-02-01 MED ORDER — ACETAMINOPHEN 325 MG PO TABS
650.0000 mg | ORAL_TABLET | Freq: Four times a day (QID) | ORAL | Status: DC | PRN
  Administered 2016-02-02: 650 mg via ORAL
  Filled 2016-02-01: qty 2

## 2016-02-01 MED ORDER — OXYCODONE HCL 5 MG PO TABS
5.0000 mg | ORAL_TABLET | ORAL | Status: DC | PRN
  Administered 2016-02-02 (×2): 10 mg via ORAL
  Filled 2016-02-01 (×2): qty 2

## 2016-02-01 MED ORDER — CEFAZOLIN SODIUM 1-5 GM-% IV SOLN
1.0000 g | Freq: Four times a day (QID) | INTRAVENOUS | Status: AC
  Administered 2016-02-01 – 2016-02-02 (×3): 1 g via INTRAVENOUS
  Filled 2016-02-01 (×3): qty 50

## 2016-02-01 MED ORDER — MEPERIDINE HCL 50 MG/ML IJ SOLN: 6.2500 mg | INTRAMUSCULAR | Status: DC | PRN

## 2016-02-01 MED ORDER — SODIUM CHLORIDE 0.9 % IR SOLN
Status: DC | PRN
  Administered 2016-02-01: 500 mL

## 2016-02-01 MED ORDER — LIDOCAINE 2% (20 MG/ML) 5 ML SYRINGE
INTRAMUSCULAR | Status: DC | PRN
  Administered 2016-02-01: 60 mg via INTRAVENOUS

## 2016-02-01 MED ORDER — PROPOFOL 10 MG/ML IV BOLUS
INTRAVENOUS | Status: AC
  Filled 2016-02-01: qty 20

## 2016-02-01 MED ORDER — FENTANYL CITRATE (PF) 100 MCG/2ML IJ SOLN
INTRAMUSCULAR | Status: AC
  Filled 2016-02-01: qty 2

## 2016-02-01 MED ORDER — ONDANSETRON HCL 4 MG/2ML IJ SOLN
INTRAMUSCULAR | Status: DC | PRN
  Administered 2016-02-01: 4 mg via INTRAVENOUS

## 2016-02-01 MED ORDER — CEFAZOLIN SODIUM-DEXTROSE 2-4 GM/100ML-% IV SOLN
2.0000 g | INTRAVENOUS | Status: AC
  Administered 2016-02-01: 2 g via INTRAVENOUS

## 2016-02-01 MED ORDER — MIDAZOLAM HCL 2 MG/2ML IJ SOLN
INTRAMUSCULAR | Status: AC
  Filled 2016-02-01: qty 2

## 2016-02-01 MED ORDER — ONDANSETRON HCL 4 MG PO TABS: 4.0000 mg | ORAL_TABLET | Freq: Four times a day (QID) | ORAL | Status: DC | PRN

## 2016-02-01 MED ORDER — LACTATED RINGERS IV SOLN
INTRAVENOUS | Status: DC
  Administered 2016-02-01 (×2): via INTRAVENOUS

## 2016-02-01 MED ORDER — ASPIRIN EC 325 MG PO TBEC
325.0000 mg | DELAYED_RELEASE_TABLET | Freq: Two times a day (BID) | ORAL | Status: DC
  Administered 2016-02-02: 325 mg via ORAL
  Filled 2016-02-01 (×4): qty 1

## 2016-02-01 MED ORDER — ACETAMINOPHEN 650 MG RE SUPP: 650.0000 mg | Freq: Four times a day (QID) | RECTAL | Status: DC | PRN

## 2016-02-01 MED ORDER — METHOCARBAMOL 1000 MG/10ML IJ SOLN
500.0000 mg | Freq: Four times a day (QID) | INTRAMUSCULAR | Status: DC | PRN
  Administered 2016-02-01: 500 mg via INTRAVENOUS
  Filled 2016-02-01: qty 5
  Filled 2016-02-01: qty 550

## 2016-02-01 MED ORDER — MIDAZOLAM HCL 2 MG/2ML IJ SOLN
INTRAMUSCULAR | Status: AC
Start: 2016-02-01 — End: 2016-02-01
  Filled 2016-02-01: qty 2

## 2016-02-01 MED ORDER — METOCLOPRAMIDE HCL 5 MG/ML IJ SOLN: 5.0000 mg | Freq: Three times a day (TID) | INTRAMUSCULAR | Status: DC | PRN

## 2016-02-01 MED ORDER — HYDROMORPHONE HCL 1 MG/ML IJ SOLN
1.0000 mg | INTRAMUSCULAR | Status: DC | PRN
  Administered 2016-02-02: 1 mg via INTRAVENOUS
  Filled 2016-02-01: qty 1

## 2016-02-01 MED ORDER — DIPHENHYDRAMINE HCL 12.5 MG/5ML PO ELIX: 12.5000 mg | ORAL_SOLUTION | ORAL | Status: DC | PRN

## 2016-02-01 MED ORDER — SODIUM CHLORIDE 0.9 % IV SOLN
INTRAVENOUS | Status: DC
  Administered 2016-02-01: 23:00:00 via INTRAVENOUS

## 2016-02-01 MED ORDER — LIDOCAINE HCL (CARDIAC) 20 MG/ML IV SOLN
INTRAVENOUS | Status: AC
  Filled 2016-02-01: qty 5

## 2016-02-01 MED ORDER — ONDANSETRON HCL 4 MG/2ML IJ SOLN
INTRAMUSCULAR | Status: AC
  Filled 2016-02-01: qty 2

## 2016-02-01 MED ORDER — CEFAZOLIN SODIUM-DEXTROSE 2-4 GM/100ML-% IV SOLN
INTRAVENOUS | Status: AC
  Filled 2016-02-01: qty 100

## 2016-02-01 MED ORDER — METHOCARBAMOL 500 MG PO TABS
500.0000 mg | ORAL_TABLET | Freq: Four times a day (QID) | ORAL | Status: DC | PRN
  Administered 2016-02-02: 500 mg via ORAL
  Filled 2016-02-01: qty 1

## 2016-02-01 MED ORDER — METOCLOPRAMIDE HCL 10 MG PO TABS: 5.0000 mg | ORAL_TABLET | Freq: Three times a day (TID) | ORAL | Status: DC | PRN

## 2016-02-01 MED ORDER — BUPIVACAINE-EPINEPHRINE (PF) 0.5% -1:200000 IJ SOLN
INTRAMUSCULAR | Status: DC | PRN
  Administered 2016-02-01 (×2): 20 mL via PERINEURAL

## 2016-02-01 MED ORDER — BUPIVACAINE HCL (PF) 0.5 % IJ SOLN
INTRAMUSCULAR | Status: AC
  Filled 2016-02-01: qty 30

## 2016-02-01 MED ORDER — DEXAMETHASONE SODIUM PHOSPHATE 10 MG/ML IJ SOLN
INTRAMUSCULAR | Status: AC
  Filled 2016-02-01: qty 1

## 2016-02-01 MED ORDER — MIDAZOLAM HCL 2 MG/2ML IJ SOLN
2.0000 mg | Freq: Once | INTRAMUSCULAR | Status: AC
  Administered 2016-02-01: 2 mg via INTRAVENOUS

## 2016-02-01 MED ORDER — DEXAMETHASONE SODIUM PHOSPHATE 10 MG/ML IJ SOLN
INTRAMUSCULAR | Status: DC | PRN
  Administered 2016-02-01: 10 mg via INTRAVENOUS

## 2016-02-01 MED ORDER — PROPOFOL 10 MG/ML IV BOLUS
INTRAVENOUS | Status: DC | PRN
  Administered 2016-02-01: 170 mg via INTRAVENOUS

## 2016-02-01 MED ORDER — PROMETHAZINE HCL 25 MG/ML IJ SOLN: 6.2500 mg | INTRAMUSCULAR | Status: DC | PRN

## 2016-02-01 MED ORDER — FENTANYL CITRATE (PF) 250 MCG/5ML IJ SOLN
INTRAMUSCULAR | Status: AC
  Filled 2016-02-01: qty 5

## 2016-02-01 MED ORDER — HYDROMORPHONE HCL 1 MG/ML IJ SOLN: 0.2500 mg | INTRAMUSCULAR | Status: DC | PRN

## 2016-02-01 MED ORDER — CHLORHEXIDINE GLUCONATE 4 % EX LIQD: 60.0000 mL | Freq: Once | CUTANEOUS | Status: DC

## 2016-02-01 MED ORDER — LACTATED RINGERS IV SOLN: INTRAVENOUS | Status: DC

## 2016-02-01 MED ORDER — BUPIVACAINE-EPINEPHRINE (PF) 0.5% -1:200000 IJ SOLN
INTRAMUSCULAR | Status: AC
  Filled 2016-02-01: qty 60

## 2016-02-01 SURGICAL SUPPLY — 57 items
BANDAGE ACE 4X5 VEL STRL LF (GAUZE/BANDAGES/DRESSINGS) ×2 IMPLANT
BANDAGE ACE 6X5 VEL STRL LF (GAUZE/BANDAGES/DRESSINGS) ×2 IMPLANT
BIT DRILL CANN 2.7 (BIT) ×1
BIT DRILL SRG 2.7XCANN AO CPLG (BIT) ×1 IMPLANT
BIT DRL SRG 2.7XCANN AO CPLNG (BIT) ×1
CUFF TOURN SGL QUICK 34 (TOURNIQUET CUFF) ×1
CUFF TRNQT CYL 34X4X40X1 (TOURNIQUET CUFF) ×1 IMPLANT
DECANTER SPIKE VIAL GLASS SM (MISCELLANEOUS) ×2 IMPLANT
DRAPE C-ARM 42X120 X-RAY (DRAPES) ×2 IMPLANT
DRAPE U-SHAPE 47X51 STRL (DRAPES) ×2 IMPLANT
DRILL 2.6X122MM WL AO SHAFT (BIT) ×2 IMPLANT
DRSG PAD ABDOMINAL 8X10 ST (GAUZE/BANDAGES/DRESSINGS) ×4 IMPLANT
DURAPREP 26ML APPLICATOR (WOUND CARE) ×2 IMPLANT
ELECT REM PT RETURN 9FT ADLT (ELECTROSURGICAL) ×2
ELECTRODE REM PT RTRN 9FT ADLT (ELECTROSURGICAL) ×1 IMPLANT
GAUZE SPONGE 4X4 12PLY STRL (GAUZE/BANDAGES/DRESSINGS) ×2 IMPLANT
GAUZE XEROFORM 5X9 LF (GAUZE/BANDAGES/DRESSINGS) ×2 IMPLANT
GLOVE BIO SURGEON STRL SZ7.5 (GLOVE) ×2 IMPLANT
GLOVE BIOGEL PI IND STRL 7.0 (GLOVE) ×1 IMPLANT
GLOVE BIOGEL PI IND STRL 8 (GLOVE) ×2 IMPLANT
GLOVE BIOGEL PI INDICATOR 7.0 (GLOVE) ×1
GLOVE BIOGEL PI INDICATOR 8 (GLOVE) ×2
GLOVE ECLIPSE 8.0 STRL XLNG CF (GLOVE) ×2 IMPLANT
GLOVE SS PI 9.0 STRL (GLOVE) ×4 IMPLANT
GLOVE SURG SS PI 7.0 STRL IVOR (GLOVE) ×2 IMPLANT
GOWN SPEC L3 XXLG W/TWL (GOWN DISPOSABLE) ×2 IMPLANT
GOWN STRL REUS W/TWL LRG LVL3 (GOWN DISPOSABLE) ×2 IMPLANT
GOWN STRL REUS W/TWL XL LVL3 (GOWN DISPOSABLE) ×4 IMPLANT
K-WIRE 1.4X100 (WIRE) ×2
K-WIRE ORTHOPEDIC 1.4X150L (WIRE) ×4
KIT BASIN OR (CUSTOM PROCEDURE TRAY) ×2 IMPLANT
KWIRE 1.4X100 (WIRE) ×1 IMPLANT
KWIRE ORTHOPEDIC 1.4X150L (WIRE) ×2 IMPLANT
NEEDLE HYPO 22GX1.5 SAFETY (NEEDLE) ×2 IMPLANT
PACK TOTAL JOINT (CUSTOM PROCEDURE TRAY) ×2 IMPLANT
PAD ABD 8X10 STRL (GAUZE/BANDAGES/DRESSINGS) ×2 IMPLANT
PAD CAST 4YDX4 CTTN HI CHSV (CAST SUPPLIES) ×1 IMPLANT
PADDING CAST COTTON 4X4 STRL (CAST SUPPLIES) ×1
PADDING CAST COTTON 6X4 STRL (CAST SUPPLIES) ×2 IMPLANT
PLATE 6H 113MM (Plate) ×2 IMPLANT
POSITIONER SURGICAL ARM (MISCELLANEOUS) ×2 IMPLANT
SCREW 50X4.0MM (Screw) ×2 IMPLANT
SCREW BONE 3.5X16MM (Screw) ×2 IMPLANT
SCREW BONE 3.5X44 (Screw) ×2 IMPLANT
SCREW BONE NON-LCKING 3.5X12MM (Screw) ×6 IMPLANT
SCREW FT 50X4.0MM (Screw) ×2 IMPLANT
SCREW LOCK 3.5X10MM (Screw) ×4 IMPLANT
SCREW LOCKING 3.5X12 (Screw) ×2 IMPLANT
SCREW NL 3.5X42MM (Screw) ×2 IMPLANT
SUT ETHILON 2 0 PS N (SUTURE) ×8 IMPLANT
SUT ETHILON 4 0 PS 2 18 (SUTURE) ×4 IMPLANT
SUT VIC AB 0 CT1 36 (SUTURE) ×4 IMPLANT
SUT VIC AB 2-0 CT1 27 (SUTURE) ×1
SUT VIC AB 2-0 CT1 TAPERPNT 27 (SUTURE) ×1 IMPLANT
SYR CONTROL 10ML LL (SYRINGE) ×2 IMPLANT
TOWEL OR 17X26 10 PK STRL BLUE (TOWEL DISPOSABLE) ×4 IMPLANT
TOWEL OR NON WOVEN STRL DISP B (DISPOSABLE) ×2 IMPLANT

## 2016-02-01 NOTE — Anesthesia Postprocedure Evaluation (Signed)
Anesthesia Post Note  Patient: Charles Hines  Procedure(s) Performed: Procedure(s) (LRB): REMOVAL OF EXTERNAL FIXATOR LEFT ANKLE AND OPEN REDUCTION INTERNAL FIXATION (ORIF) LEFT BIMALLEOLAR ANKLE FRACTURE AND STABILIZATION OF SYNDESMOSIS (Left)  Patient location during evaluation: PACU Anesthesia Type: General and Regional Level of consciousness: awake and alert Pain management: pain level controlled Vital Signs Assessment: post-procedure vital signs reviewed and stable Respiratory status: spontaneous breathing, nonlabored ventilation, respiratory function stable and patient connected to nasal cannula oxygen Cardiovascular status: blood pressure returned to baseline and stable Postop Assessment: no signs of nausea or vomiting Anesthetic complications: no    Last Vitals:  Filed Vitals:   02/01/16 1700 02/01/16 1715  BP: 130/86   Pulse: 75   Temp:  36.7 C  Resp: 11     Last Pain:  Filed Vitals:   02/01/16 1717  PainSc: 0-No pain    LLE Motor Response: No movement due to regional block (02/01/16 1715) LLE Sensation: Numbness (02/01/16 1715)          Shelton SilvasKevin D Neeraj Housand

## 2016-02-01 NOTE — Anesthesia Procedure Notes (Addendum)
Anesthesia Regional Block:  Adductor canal block  Pre-Anesthetic Checklist: ,, timeout performed, Correct Patient, Correct Site, Correct Laterality, Correct Procedure, Correct Position, site marked, Risks and benefits discussed,  Surgical consent,  Pre-op evaluation,  At surgeon's request and post-op pain management  Laterality: Left  Prep: chloraprep       Needles:  Injection technique: Single-shot  Needle Type: Echogenic Needle     Needle Length: 9cm 9 cm Needle Gauge: 21 and 21 G    Additional Needles:  Procedures: ultrasound guided (picture in chart) Adductor canal block Narrative:  Start time: 02/01/2016 1:23 PM End time: 02/01/2016 1:25 PM Injection made incrementally with aspirations every 5 mL.  Performed by: Personally  Anesthesiologist: Shona SimpsonHOLLIS, KEVIN D  Additional Notes: No immediate complications noted. Pt tolerated well.    Anesthesia Regional Block:  Popliteal block  Pre-Anesthetic Checklist: ,, timeout performed, Correct Patient, Correct Site, Correct Laterality, Correct Procedure, Correct Position, site marked, Risks and benefits discussed,  Surgical consent,  Pre-op evaluation,  At surgeon's request and post-op pain management  Laterality: Left  Prep: chloraprep       Needles:  Injection technique: Single-shot  Needle Type: Echogenic Needle     Needle Length: 9cm 9 cm Needle Gauge: 21 and 21 G    Additional Needles:  Procedures: ultrasound guided (picture in chart) Popliteal block Narrative:  Start time: 02/01/2016 1:20 PM End time: 02/01/2016 1:22 PM Injection made incrementally with aspirations every 5 mL.  Performed by: Personally  Anesthesiologist: Shona SimpsonHOLLIS, KEVIN D  Additional Notes: Pt tolerated well. No immediate complications noted.    Procedure Name: LMA Insertion Date/Time: 02/01/2016 2:29 PM Performed by: Jhonnie GarnerMARSHALL, Imanii Gosdin M Pre-anesthesia Checklist: Patient identified, Emergency Drugs available, Suction available and Patient being  monitored Patient Re-evaluated:Patient Re-evaluated prior to inductionOxygen Delivery Method: Circle system utilized Preoxygenation: Pre-oxygenation with 100% oxygen Intubation Type: IV induction Ventilation: Mask ventilation without difficulty LMA: LMA inserted LMA Size: 4.0 Number of attempts: 1 Placement Confirmation: positive ETCO2 and breath sounds checked- equal and bilateral Tube secured with: Tape Dental Injury: Teeth and Oropharynx as per pre-operative assessment

## 2016-02-01 NOTE — Transfer of Care (Signed)
Immediate Anesthesia Transfer of Care Note  Patient: Charles Hines  Procedure(s) Performed: Procedure(s) with comments: REMOVAL OF EXTERNAL FIXATOR LEFT ANKLE AND OPEN REDUCTION INTERNAL FIXATION (ORIF) LEFT BIMALLEOLAR ANKLE FRACTURE AND STABILIZATION OF SYNDESMOSIS (Left) - Recieved a popliteal and adductor block  Patient Location: PACU  Anesthesia Type:General  Level of Consciousness:  sedated, patient cooperative and responds to stimulation  Airway & Oxygen Therapy:Patient Spontanous Breathing and Patient connected to face mask oxgen  Post-op Assessment:  Report given to PACU RN and Post -op Vital signs reviewed and stable  Post vital signs:  Reviewed and stable  Last Vitals:  Filed Vitals:   02/01/16 1345 02/01/16 1641  BP: 122/71 128/95  Pulse: 80 73  Temp:  36.7 C  Resp: 19 0    Complications: No apparent anesthesia complications

## 2016-02-01 NOTE — Anesthesia Preprocedure Evaluation (Addendum)
Anesthesia Evaluation  Patient identified by MRN, date of birth, ID band Patient awake    Reviewed: Allergy & Precautions, NPO status , Patient's Chart, lab work & pertinent test results  Airway Mallampati: I  TM Distance: >3 FB Neck ROM: Full    Dental  (+) Teeth Intact   Pulmonary former smoker,    breath sounds clear to auscultation- rhonchi       Cardiovascular negative cardio ROS   Rhythm:Regular Rate:Normal     Neuro/Psych negative neurological ROS  negative psych ROS   GI/Hepatic negative GI ROS, Neg liver ROS,   Endo/Other  negative endocrine ROS  Renal/GU negative Renal ROS  negative genitourinary   Musculoskeletal negative musculoskeletal ROS (+)   Abdominal   Peds negative pediatric ROS (+)  Hematology negative hematology ROS (+)   Anesthesia Other Findings   Reproductive/Obstetrics negative OB ROS                            Lab Results  Component Value Date   WBC 3.8* 02/01/2016   HGB 12.9* 02/01/2016   HCT 37.9* 02/01/2016   MCV 82.6 02/01/2016   PLT 414* 02/01/2016   Lab Results  Component Value Date   CREATININE 0.76 01/13/2016   BUN 10 01/13/2016   NA 141 01/13/2016   K 3.7 01/13/2016   CL 104 01/13/2016   CO2 26 01/13/2016   No results found for: INR, PROTIME  12/2015 EKG: normal sinus rhythm.   Anesthesia Physical Anesthesia Plan  ASA: II  Anesthesia Plan: General   Post-op Pain Management: GA combined w/ Regional for post-op pain   Induction: Intravenous  Airway Management Planned: LMA  Additional Equipment:   Intra-op Plan:   Post-operative Plan: Extubation in OR  Informed Consent: I have reviewed the patients History and Physical, chart, labs and discussed the procedure including the risks, benefits and alternatives for the proposed anesthesia with the patient or authorized representative who has indicated his/her understanding and  acceptance.   Dental advisory given  Plan Discussed with: CRNA  Anesthesia Plan Comments:         Anesthesia Quick Evaluation

## 2016-02-01 NOTE — Brief Op Note (Signed)
02/01/2016  4:15 PM  PATIENT:  Charles Hines  [age] y.o. male  PRE-OPERATIVE DIAGNOSIS:  left bimalleolar ankle fracture with external fixator  POST-OPERATIVE DIAGNOSIS:  left bimalleolar ankle fracture with external fixator  PROCEDURE:  Procedure(s) with comments: REMOVAL OF EXTERNAL FIXATOR LEFT ANKLE AND OPEN REDUCTION INTERNAL FIXATION (ORIF) LEFT BIMALLEOLAR ANKLE FRACTURE AND STABILIZATION OF SYNDESMOSIS (Left) - Recieved a popliteal and adductor block  SURGEON:  Surgeon(s) and Role:    * Kathryne Hitchhristopher Y Majed Pellegrin, MD - Primary  PHYSICIAN ASSISTANT: Rexene EdisonGil Clark, PA-C  ANESTHESIA:   regional and general  EBL:  Total I/O In: -  Out: 50 [Blood:50]  COUNTS:  YES  TOURNIQUET:  * Missing tourniquet times found for documented tourniquets in log:  [phone] *  DICTATION: .Other Dictation: Dictation Number [id number]  PLAN OF CARE: Admit for overnight observation  PATIENT DISPOSITION:  PACU - hemodynamically stable.   Delay start of Pharmacological VTE agent (>24hrs) due to surgical blood loss or risk of bleeding: no

## 2016-02-01 NOTE — Progress Notes (Signed)
Assisted Dr. Hart RochesterHollis with left, popliteal/saphenous, and adductor canal block with ultrasound. Side rails up, monitors on throughout procedure. See vital signs in flow sheet. Tolerated Procedure well.
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
## 2016-02-02 DIAGNOSIS — S82842A Displaced bimalleolar fracture of left lower leg, initial encounter for closed fracture: Secondary | ICD-10-CM | POA: Diagnosis not present

## 2016-02-02 LAB — BASIC METABOLIC PANEL
Anion gap: 9 (ref 5–15)
BUN: 11 mg/dL (ref 6–20)
CALCIUM: 9.1 mg/dL (ref 8.9–10.3)
CHLORIDE: 104 mmol/L (ref 101–111)
CO2: 27 mmol/L (ref 22–32)
CREATININE: 0.67 mg/dL (ref 0.61–1.24)
Glucose, Bld: 117 mg/dL — ABNORMAL HIGH (ref 65–99)
Potassium: 3.6 mmol/L (ref 3.5–5.1)
SODIUM: 140 mmol/L (ref 135–145)

## 2016-02-02 MED ORDER — OXYCODONE-ACETAMINOPHEN 5-325 MG PO TABS: 1.0000 | ORAL_TABLET | ORAL | Status: DC | PRN

## 2016-02-02 MED ORDER — ASPIRIN EC 325 MG PO TBEC: 325.0000 mg | DELAYED_RELEASE_TABLET | Freq: Every day | ORAL | Status: DC

## 2016-02-02 MED ORDER — TIZANIDINE HCL 4 MG PO TABS: 4.0000 mg | ORAL_TABLET | Freq: Three times a day (TID) | ORAL | Status: DC | PRN

## 2016-02-02 NOTE — Evaluation (Signed)
Physical Therapy Evaluation Patient Details Name: Charles Hines MRN: [id number] DOB: 03/04/1976 Today's Date: 02/02/2016   History of Present Illness  s/p removal external fixation L bimalleolar ankle fx, SP ORIF 02/01/16  Clinical Impression  The patient has been using RW and crutches for steps PTA. Reviewed steps, ice and elevation. Ready for DC.    Follow Up Recommendations No PT follow up    Equipment Recommendations  None recommended by PT    Recommendations for Other Services       Precautions / Restrictions Precautions Precautions: Fall Restrictions LLE Weight Bearing: Non weight bearing      Mobility  Bed Mobility Overal bed mobility: Modified Independent                Transfers Overall transfer level: Modified independent Equipment used: Rolling walker (2 wheeled)   Sit to Stand: Modified independent (Device/Increase time)            Ambulation/Gait Ambulation/Gait assistance: Modified independent (Device/Increase time) Ambulation Distance (Feet): 50 Feet Assistive device: Rolling walker (2 wheeled) Gait Pattern/deviations: Step-to pattern     General Gait Details: cues for RW distance from self  Stairs Stairs: Yes Stairs assistance: Min assist Stair Management: One rail Right;Step to pattern;Forwards;With crutches Number of Stairs: 2 General stair comments: cues for safety  Wheelchair Mobility    Modified Rankin (Stroke Patients Only)       Balance                                             Pertinent Vitals/Pain Pain Score: 3  Pain Location: L ankle Pain Intervention(s): Limited activity within patient's tolerance;Monitored during session;Premedicated before session;Ice applied;Repositioned    Home Living Family/patient expects to be discharged to:: Private residence Living Arrangements: Spouse/significant other Available Help at Discharge: Family Type of Home: House Home Access: Stairs to  enter Entrance Stairs-Rails: None Secretary/administratorntrance Stairs-Number of Steps: 4-5 Home Layout: Two level Home Equipment: Environmental consultantWalker - 2 wheels;Crutches      Prior Function Level of Independence: Independent with assistive device(s)               Hand Dominance        Extremity/Trunk Assessment   Upper Extremity Assessment: Overall WFL for tasks assessed           Lower Extremity Assessment: LLE deficits/detail         Communication   Communication: Prefers language other than English  Cognition Arousal/Alertness: Awake/alert Behavior During Therapy: WFL for tasks assessed/performed Overall Cognitive Status: Within Functional Limits for tasks assessed                      General Comments      Exercises        Assessment/Plan    PT Assessment Patent does not need any further PT services  PT Diagnosis Difficulty walking;Acute pain   PT Problem List Decreased activity tolerance;Decreased mobility  PT Treatment Interventions     PT Goals (Current goals can be found in the Care Plan section) Acute Rehab PT Goals Patient Stated Goal: home today PT Goal Formulation: All assessment and education complete, DC therapy    Frequency     Barriers to discharge        Co-evaluation               End of Session Equipment  Utilized During Treatment: Gait belt Activity Tolerance: Patient tolerated treatment well Patient left: in chair;with call bell/phone within reach      Functional Assessment Tool Used: clinical judgement Functional Limitation: Mobility: Walking and moving around Mobility: Walking and Moving Around Current Status [phone]): At least 1 percent but less than 20 percent impaired, limited or restricted Mobility: Walking and Moving Around Goal Status [phone]): At least 1 percent but less than 20 percent impaired, limited or restricted Mobility: Walking and Moving Around Discharge Status [phone]): At least 1 percent but less than 20 percent  impaired, limited or restricted    Time: 1042-1120 PT Time Calculation (min) (ACUTE ONLY): 38 min   Charges:   PT Evaluation $PT Eval Low Complexity: 1 Procedure PT Treatments $Gait Training: 8-22 mins $Self Care/Home Management: 8-22   PT G Codes:   PT G-Codes **NOT FOR INPATIENT CLASS** Functional Assessment Tool Used: clinical judgement Functional Limitation: Mobility: Walking and moving around Mobility: Walking and Moving Around Current Status (B1478): At least 1 percent but less than 20 percent impaired, limited or restricted Mobility: Walking and Moving Around Goal Status [phone]): At least 1 percent but less than 20 percent impaired, limited or restricted Mobility: Walking and Moving Around Discharge Status [phone]): At least 1 percent but less than 20 percent impaired, limited or restricted    Rada Hay 02/02/2016, 12:26 PM Blanchard Kelch PT [phone]
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
## 2016-02-02 NOTE — Progress Notes (Signed)
Pt discharged with dc paper work and prescriptions;  escorted safely to lobby via wheelchair by nursing tech. Pt accompanied by family

## 2016-02-02 NOTE — Discharge Instructions (Signed)
No weight on your left ankle at all. Ice and elevation as needed for swelling.

## 2016-02-02 NOTE — Progress Notes (Signed)
Subjective: 1 Day Post-Op Procedure(s) (LRB): REMOVAL OF EXTERNAL FIXATOR LEFT ANKLE AND OPEN REDUCTION INTERNAL FIXATION (ORIF) LEFT BIMALLEOLAR ANKLE FRACTURE AND STABILIZATION OF SYNDESMOSIS (Left) Patient reports pain as moderate.    Objective: Vital signs in last 24 hours: Temp:  [97.7 F (36.5 C)-98.9 F (37.2 C)] 98.3 F (36.8 C) (06/03 1006) Pulse Rate:  [48-84] 50 (06/03 1006) Resp:  [11-22] 16 (06/03 1006) BP: (121-146)/(65-95) 133/68 mmHg (06/03 1006) SpO2:  [99 %-100 %] 100 % (06/03 1006) Weight:  [74.844 kg (165 lb)] 74.844 kg (165 lb) (06/02 1145)  Intake/Output from previous day: 06/02 0701 - 06/03 0700 In: 2420 [P.O.:120; I.V.:2150; IV Piggyback:150] Out: 1950 [Urine:1900; Blood:50] Intake/Output this shift: Total I/O In: 240 [P.O.:240] Out: 1100 [Urine:1100]   Recent Labs  02/01/16 1200  HGB 12.9*    Recent Labs  02/01/16 1200  WBC 3.8*  RBC 4.59  HCT 37.9*  PLT 414*    Recent Labs  02/02/16 0510  NA 140  K 3.6  CL 104  CO2 27  BUN 11  CREATININE 0.67  GLUCOSE 117*  CALCIUM 9.1   No results for input(s): LABPT, INR in the last 72 hours.  Sensation intact distally Intact pulses distally Incision: dressing C/D/I Compartment soft  Assessment/Plan: 1 Day Post-Op Procedure(s) (LRB): REMOVAL OF EXTERNAL FIXATOR LEFT ANKLE AND OPEN REDUCTION INTERNAL FIXATION (ORIF) LEFT BIMALLEOLAR ANKLE FRACTURE AND STABILIZATION OF SYNDESMOSIS (Left) Up with therapy  Discharge to home today.  Becky Berberian Y 02/02/2016, 10:40 AM
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
## 2016-02-04 ENCOUNTER — Encounter (HOSPITAL_COMMUNITY): Payer: Self-pay | Admitting: Orthopaedic Surgery

## 2016-02-04 NOTE — Addendum Note (Signed)
Addendum  created 02/04/16 1229 by Elyn PeersSandra J Carsen Machi, CRNA   Modules edited: Charges VN

## 2016-03-07 ENCOUNTER — Ambulatory Visit: Payer: BLUE CROSS/BLUE SHIELD | Attending: Internal Medicine

## 2016-12-06 ENCOUNTER — Ambulatory Visit (INDEPENDENT_AMBULATORY_CARE_PROVIDER_SITE_OTHER): Payer: BLUE CROSS/BLUE SHIELD | Admitting: Physician Assistant

## 2016-12-06 VITALS — BP 135/79 | HR 50 | Temp 98.6°F | Resp 17 | Ht 70.5 in | Wt 178.0 lb

## 2016-12-06 DIAGNOSIS — Z0000 Encounter for general adult medical examination without abnormal findings: Secondary | ICD-10-CM | POA: Diagnosis not present

## 2016-12-06 DIAGNOSIS — Z130 Encounter for screening for diseases of the blood and blood-forming organs and certain disorders involving the immune mechanism: Secondary | ICD-10-CM

## 2016-12-06 DIAGNOSIS — Z1329 Encounter for screening for other suspected endocrine disorder: Secondary | ICD-10-CM

## 2016-12-06 DIAGNOSIS — Z13220 Encounter for screening for lipoid disorders: Secondary | ICD-10-CM | POA: Diagnosis not present

## 2016-12-06 DIAGNOSIS — H6123 Impacted cerumen, bilateral: Secondary | ICD-10-CM

## 2016-12-06 DIAGNOSIS — Z13228 Encounter for screening for other metabolic disorders: Secondary | ICD-10-CM

## 2016-12-06 MED ORDER — CARBAMIDE PEROXIDE 6.5 % OT SOLN: 5.0000 [drp] | Freq: Two times a day (BID) | OTIC | 0 refills | Status: DC

## 2016-12-06 NOTE — Patient Instructions (Addendum)
RECOMMEND THAT YOU HAVE AT LEAST 7-8 HOURS OF SLEEP PER NIGHT EXERCISE 4 TIMES PER WEEK FOR 30 MINUTES OF CONSTANT HEART PUMPING ACTIVITY (WALK, RUN, SWIM, ETC). INCREASE YOUR VEGETABLE EATING.   Keeping you healthy  Get these tests  Blood pressure- Have your blood pressure checked once a year by your healthcare provider.  Normal blood pressure is 120/80.  Weight- Have your body mass index (BMI) calculated to screen for obesity.  BMI is a measure of body fat based on height and weight. You can also calculate your own BMI at https://www.west-esparza.com/.  Cholesterol- Have your cholesterol checked regularly starting at age 68, sooner may be necessary if you have diabetes, high blood pressure, if a family member developed heart diseases at an early age or if you smoke.   Chlamydia, HIV, and other sexual transmitted disease- Get screened each year until the age of 69 then within three months of each new sexual partner.  Diabetes- Have your blood sugar checked regularly if you have high blood pressure, high cholesterol, a family history of diabetes or if you are overweight.  Get these vaccines  Flu shot- Every fall.  Tetanus shot- Every 10 years.  Menactra- Single dose; prevents meningitis.  Take these steps  Don't smoke- If you do smoke, ask your healthcare provider about quitting. For tips on how to quit, go to www.smokefree.gov or call 1-800-QUIT-NOW.  Be physically active- Exercise 5 days a week for at least 30 minutes.  If you are not already physically active start slow and gradually work up to 30 minutes of moderate physical activity.  Examples of moderate activity include walking briskly, mowing the yard, dancing, swimming bicycling, etc.  Eat a healthy diet- Eat a variety of healthy foods such as fruits, vegetables, low fat milk, low fat cheese, yogurt, lean meats, poultry, fish, beans, tofu, etc.  For more information on healthy eating, go to www.thenutritionsource.org  Drink  alcohol in moderation- Limit alcohol intake two drinks or less a day.  Never drink and drive.  Dentist- Brush and floss teeth twice daily; visit your dentis twice a year.  Depression-Your emotional health is as important as your physical health.  If you're feeling down, losing interest in things you normally enjoy please talk with your healthcare provider.  Gun Safety- If you keep a gun in your home, keep it unloaded and with the safety lock on.  Bullets should be stored separately.  Helmet use- Always wear a helmet when riding a motorcycle, bicycle, rollerblading or skateboarding.  Safe sex- If you may be exposed to a sexually transmitted infection, use a condom  Seat belts- Seat bels can save your life; always wear one.  Smoke/Carbon Monoxide detectors- These detectors need to be installed on the appropriate level of your home.  Replace batteries at least once a year.  Skin Cancer- When out in the sun, cover up and use sunscreen SPF 15 or higher.  Violence- If anyone is threatening or hurting you, please tell your healthcare provider.    IF you received an x-ray today, you will receive an invoice from [facility]. Please contact [facility] at [phone] with questions or concerns regarding your invoice.   IF you received labwork today, you will receive an invoice from [facility]. Please contact [facility] at [phone] with questions or concerns regarding your invoice.   Our billing staff will not be able to assist you with questions regarding bills from these companies.  You will be contacted with the lab results as  soon as they are available. The fastest way to get your results is to activate your My Chart account. Instructions are located on the last page of this paperwork. If you have not heard from [place] regarding the results in 2 weeks, please contact this office.
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
## 2016-12-07 LAB — CBC
Hematocrit: 42.1 % (ref 37.5–51.0)
Hemoglobin: 14.1 g/dL (ref 13.0–17.7)
MCH: 27.8 pg (ref 26.6–33.0)
MCHC: 33.5 g/dL (ref 31.5–35.7)
MCV: 83 fL (ref 79–97)
PLATELETS: 255 10*3/uL (ref 150–379)
RBC: 5.08 x10E6/uL (ref 4.14–5.80)
RDW: 14.3 % (ref 12.3–15.4)
WBC: 4 10*3/uL (ref 3.4–10.8)

## 2016-12-07 LAB — LIPID PANEL
CHOLESTEROL TOTAL: 190 mg/dL (ref 100–199)
Chol/HDL Ratio: 4 ratio (ref 0.0–5.0)
HDL: 47 mg/dL (ref >39)
LDL Calculated: 122 mg/dL — ABNORMAL HIGH (ref 0–99)
TRIGLYCERIDES: 104 mg/dL (ref 0–149)
VLDL Cholesterol Cal: 21 mg/dL (ref 5–40)

## 2016-12-07 LAB — CMP14+EGFR
ALT: 14 IU/L (ref 0–44)
AST: 15 IU/L (ref 0–40)
Albumin/Globulin Ratio: 1.8 (ref 1.2–2.2)
Albumin: 4.7 g/dL (ref 3.5–5.5)
Alkaline Phosphatase: 76 IU/L (ref 39–117)
BUN/Creatinine Ratio: 14 (ref 9–20)
BUN: 11 mg/dL (ref 6–24)
Bilirubin Total: 0.3 mg/dL (ref 0.0–1.2)
CALCIUM: 9.8 mg/dL (ref 8.7–10.2)
CHLORIDE: 101 mmol/L (ref 96–106)
CO2: 26 mmol/L (ref 18–29)
Creatinine, Ser: 0.76 mg/dL (ref 0.76–1.27)
GFR calc Af Amer: 130 mL/min/{1.73_m2} (ref >59)
GFR calc non Af Amer: 113 mL/min/{1.73_m2} (ref >59)
GLUCOSE: 102 mg/dL — AB (ref 65–99)
Globulin, Total: 2.6 g/dL (ref 1.5–4.5)
POTASSIUM: 3.9 mmol/L (ref 3.5–5.2)
Sodium: 142 mmol/L (ref 134–144)
TOTAL PROTEIN: 7.3 g/dL (ref 6.0–8.5)

## 2016-12-07 LAB — TSH: TSH: 2.69 u[IU]/mL (ref 0.450–4.500)

## 2016-12-11 ENCOUNTER — Telehealth: Payer: Self-pay | Admitting: Physician Assistant

## 2016-12-11 NOTE — Telephone Encounter (Signed)
Pt is looking for paperwork from [place] that he had filled out from DOS of 12/06/16. It is a health assessment form. Please advise [phone]

## 2016-12-11 NOTE — Telephone Encounter (Signed)
I think I put it under [place], not [place]

## 2016-12-11 NOTE — Telephone Encounter (Signed)
Have you seen this ?

## 2017-05-18 ENCOUNTER — Emergency Department (HOSPITAL_COMMUNITY)
Admission: EM | Admit: 2017-05-18 | Discharge: 2017-05-18 | Disposition: A | Discharge status: Home / Self Care | Payer: BLUE CROSS/BLUE SHIELD | Attending: Emergency Medicine | Admitting: Emergency Medicine

## 2017-05-18 ENCOUNTER — Emergency Department (HOSPITAL_COMMUNITY): Payer: BLUE CROSS/BLUE SHIELD

## 2017-05-18 ENCOUNTER — Encounter (HOSPITAL_COMMUNITY): Payer: Self-pay | Admitting: Emergency Medicine

## 2017-05-18 DIAGNOSIS — R0789 Other chest pain: Secondary | ICD-10-CM | POA: Diagnosis not present

## 2017-05-18 DIAGNOSIS — R079 Chest pain, unspecified: Secondary | ICD-10-CM | POA: Diagnosis present

## 2017-05-18 DIAGNOSIS — Z87891 Personal history of nicotine dependence: Secondary | ICD-10-CM | POA: Insufficient documentation

## 2017-05-18 LAB — CBC
HEMATOCRIT: 42.3 % (ref 39.0–52.0)
HEMOGLOBIN: 14.1 g/dL (ref 13.0–17.0)
MCH: 27.5 pg (ref 26.0–34.0)
MCHC: 33.3 g/dL (ref 30.0–36.0)
MCV: 82.5 fL (ref 78.0–100.0)
Platelets: 245 10*3/uL (ref 150–400)
RBC: 5.13 MIL/uL (ref 4.22–5.81)
RDW: 13.2 % (ref 11.5–15.5)
WBC: 5.4 10*3/uL (ref 4.0–10.5)

## 2017-05-18 LAB — BASIC METABOLIC PANEL
ANION GAP: 13 (ref 5–15)
BUN: 14 mg/dL (ref 6–20)
CO2: 22 mmol/L (ref 22–32)
Calcium: 9.7 mg/dL (ref 8.9–10.3)
Chloride: 101 mmol/L (ref 101–111)
Creatinine, Ser: 0.83 mg/dL (ref 0.61–1.24)
GFR calc Af Amer: >60 mL/min (ref >60)
Glucose, Bld: 107 mg/dL — ABNORMAL HIGH (ref 65–99)
POTASSIUM: 3.5 mmol/L (ref 3.5–5.1)
SODIUM: 136 mmol/L (ref 135–145)

## 2017-05-18 LAB — I-STAT TROPONIN, ED
Troponin i, poc: 0 ng/mL (ref 0.00–0.08)
Troponin i, poc: 0.01 ng/mL (ref 0.00–0.08)

## 2017-05-18 LAB — D-DIMER, QUANTITATIVE (NOT AT ARMC): D-Dimer, Quant: <0.27 ug/mL-FEU (ref 0.00–0.50)

## 2017-05-18 NOTE — ED Triage Notes (Signed)
Pt reports central CP with radiation to back onset 2 hrs ago. Rates pain 5/10. Denies SOB, N/V, dizziness, diaphoresis. No cardiac hx
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
## 2017-05-18 NOTE — Discharge Instructions (Signed)
Return here as needed.  Follow-up with the, Dr. provided °

## 2017-05-20 ENCOUNTER — Encounter: Payer: Self-pay | Admitting: Cardiovascular Disease

## 2017-05-20 ENCOUNTER — Ambulatory Visit (INDEPENDENT_AMBULATORY_CARE_PROVIDER_SITE_OTHER): Payer: BLUE CROSS/BLUE SHIELD | Admitting: Cardiovascular Disease

## 2017-05-20 VITALS — BP 118/74 | HR 70 | Ht 70.0 in | Wt 184.4 lb

## 2017-05-20 DIAGNOSIS — R0789 Other chest pain: Secondary | ICD-10-CM | POA: Diagnosis not present

## 2017-05-20 NOTE — Patient Instructions (Addendum)
Medication Instructions:  TRY OVER THE COUNTER ZANTAC (RANTIDINE)   Labwork: NOE  Testing/Procedures: Your physician has requested that you have en exercise stress myoview. For further information please visit [email or web address] Please follow instruction sheet, as given.  Follow-Up: Your physician recommends that you schedule a follow-up appointment in: 1 MONTH OV WITH DR Roosevelt/PA
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
## 2017-05-24 ENCOUNTER — Encounter: Payer: Self-pay | Admitting: Cardiovascular Disease

## 2017-05-24 DIAGNOSIS — R0789 Other chest pain: Secondary | ICD-10-CM

## 2017-05-24 HISTORY — DX: Other chest pain: R07.89

## 2017-05-27 ENCOUNTER — Telehealth (HOSPITAL_COMMUNITY): Payer: Self-pay

## 2017-05-27 MED ORDER — LIDOCAINE VISCOUS 2 % MT SOLN
OROMUCOSAL | Status: AC
  Filled 2017-05-27: qty 15

## 2017-05-27 MED ORDER — IOPAMIDOL (ISOVUE-300) INJECTION 61%
INTRAVENOUS | Status: AC
  Filled 2017-05-27: qty 50

## 2017-05-27 NOTE — Telephone Encounter (Signed)
Encounter complete. 

## 2017-05-28 ENCOUNTER — Telehealth (HOSPITAL_COMMUNITY): Payer: Self-pay

## 2017-05-28 NOTE — Telephone Encounter (Signed)
Encounter complete. 

## 2017-05-29 ENCOUNTER — Ambulatory Visit (HOSPITAL_COMMUNITY)
Admission: RE | Admit: 2017-05-29 | Discharge: 2017-05-29 | Disposition: A | Discharge status: Home / Self Care | Payer: BLUE CROSS/BLUE SHIELD | Source: Ambulatory Visit | Attending: Cardiovascular Disease | Admitting: Cardiovascular Disease

## 2017-05-29 DIAGNOSIS — R0602 Shortness of breath: Secondary | ICD-10-CM | POA: Diagnosis not present

## 2017-05-29 DIAGNOSIS — Z87891 Personal history of nicotine dependence: Secondary | ICD-10-CM | POA: Insufficient documentation

## 2017-05-29 DIAGNOSIS — R0789 Other chest pain: Secondary | ICD-10-CM | POA: Diagnosis not present

## 2017-05-29 DIAGNOSIS — Z8249 Family history of ischemic heart disease and other diseases of the circulatory system: Secondary | ICD-10-CM | POA: Insufficient documentation

## 2017-05-29 DIAGNOSIS — R072 Precordial pain: Secondary | ICD-10-CM | POA: Diagnosis not present

## 2017-05-29 LAB — MYOCARDIAL PERFUSION IMAGING
CHL CUP NUCLEAR SDS: 1
CHL CUP NUCLEAR SSS: 1
LVDIAVOL: 148 mL (ref 62–150)
LVSYSVOL: 78 mL
Peak HR: 96 {beats}/min
Rest HR: 51 {beats}/min
SRS: 0
TID: 1.06

## 2017-05-29 MED ORDER — TECHNETIUM TC 99M TETROFOSMIN IV KIT
29.7000 | PACK | Freq: Once | INTRAVENOUS | Status: AC | PRN
  Administered 2017-05-29: 29.7 via INTRAVENOUS
  Filled 2017-05-29: qty 30

## 2017-05-29 MED ORDER — TECHNETIUM TC 99M TETROFOSMIN IV KIT
9.8000 | PACK | Freq: Once | INTRAVENOUS | Status: AC | PRN
  Administered 2017-05-29: 9.8 via INTRAVENOUS
  Filled 2017-05-29: qty 10

## 2017-05-29 MED ORDER — REGADENOSON 0.4 MG/5ML IV SOLN
0.4000 mg | Freq: Once | INTRAVENOUS | Status: AC
  Administered 2017-05-29: 0.4 mg via INTRAVENOUS

## 2017-06-04 ENCOUNTER — Telehealth: Payer: Self-pay | Admitting: *Deleted

## 2017-06-04 DIAGNOSIS — R0789 Other chest pain: Secondary | ICD-10-CM

## 2017-06-04 DIAGNOSIS — R9430 Abnormal result of cardiovascular function study, unspecified: Secondary | ICD-10-CM

## 2017-06-04 NOTE — Telephone Encounter (Signed)
-----   Message from Chilton Si, MD sent at 06/01/2017  3:36 PM EDT ----- Low risk stress test.  It looks like his heart may be a little weak.  Please get an echo.

## 2017-06-04 NOTE — Telephone Encounter (Signed)
Released in my chart with Dr Leonides Sake comments attached and to call  Left message to call back via translator line [phone]

## 2017-06-12 NOTE — Telephone Encounter (Signed)
Message routed to admin pool to arrange appt

## 2017-06-12 NOTE — Telephone Encounter (Signed)
Patient called and notified he needs an echo. He is aware a scheduler will contact him to set up test. He has a PA follow up on 10/19. Skype message sent to [facility]

## 2017-06-12 NOTE — Telephone Encounter (Signed)
Follow up     Pt  Is returning call about stress test results

## 2017-06-15 ENCOUNTER — Telehealth: Payer: Self-pay | Admitting: Cardiovascular Disease

## 2017-06-15 NOTE — Telephone Encounter (Signed)
Left message for patient to call and schedule echo per dr Duke Salvia.

## 2017-06-16 NOTE — Telephone Encounter (Signed)
Appointment scheduled for 06/22/17 Echo and f/u with [facility] PA 11/1

## 2017-06-19 ENCOUNTER — Ambulatory Visit: Payer: BLUE CROSS/BLUE SHIELD | Admitting: Cardiology

## 2017-06-22 ENCOUNTER — Ambulatory Visit (HOSPITAL_COMMUNITY): Payer: BLUE CROSS/BLUE SHIELD | Attending: Cardiology

## 2017-06-22 ENCOUNTER — Other Ambulatory Visit: Payer: Self-pay

## 2017-06-22 DIAGNOSIS — R9430 Abnormal result of cardiovascular function study, unspecified: Secondary | ICD-10-CM | POA: Insufficient documentation

## 2017-06-22 DIAGNOSIS — I081 Rheumatic disorders of both mitral and tricuspid valves: Secondary | ICD-10-CM | POA: Insufficient documentation

## 2017-06-22 DIAGNOSIS — Z8249 Family history of ischemic heart disease and other diseases of the circulatory system: Secondary | ICD-10-CM | POA: Diagnosis not present

## 2017-06-22 DIAGNOSIS — Z87891 Personal history of nicotine dependence: Secondary | ICD-10-CM | POA: Insufficient documentation

## 2017-06-22 DIAGNOSIS — R0789 Other chest pain: Secondary | ICD-10-CM | POA: Insufficient documentation

## 2017-07-02 ENCOUNTER — Encounter: Payer: Self-pay | Admitting: Cardiology

## 2017-07-02 ENCOUNTER — Ambulatory Visit (INDEPENDENT_AMBULATORY_CARE_PROVIDER_SITE_OTHER): Payer: BLUE CROSS/BLUE SHIELD | Admitting: Cardiology

## 2017-07-02 DIAGNOSIS — Z8249 Family history of ischemic heart disease and other diseases of the circulatory system: Secondary | ICD-10-CM

## 2017-07-02 DIAGNOSIS — E785 Hyperlipidemia, unspecified: Secondary | ICD-10-CM

## 2017-07-02 DIAGNOSIS — R0789 Other chest pain: Secondary | ICD-10-CM | POA: Diagnosis not present

## 2017-07-02 NOTE — Assessment & Plan Note (Signed)
LDL 122 April 2018-followed by PCP

## 2017-07-02 NOTE — Patient Instructions (Signed)
Your physician recommends that you schedule a follow-up appointment in: As needed  
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
## 2017-07-02 NOTE — Assessment & Plan Note (Signed)
H/O sudden cardiac death

## 2017-07-02 NOTE — Assessment & Plan Note (Signed)
Myoview low risk Sept 2018 but EF 47%. EF by echo 06/22/17- 50-55% Symptoms sound more c/w GERD
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
## 2017-10-27 ENCOUNTER — Encounter: Payer: Self-pay | Admitting: Physician Assistant

## 2017-10-27 ENCOUNTER — Ambulatory Visit: Payer: BLUE CROSS/BLUE SHIELD | Admitting: Physician Assistant

## 2017-10-27 VITALS — BP 122/72 | HR 70 | Temp 98.6°F | Resp 17 | Ht 71.5 in | Wt 198.0 lb

## 2017-10-27 DIAGNOSIS — R519 Headache, unspecified: Secondary | ICD-10-CM

## 2017-10-27 DIAGNOSIS — R51 Headache: Secondary | ICD-10-CM | POA: Diagnosis not present

## 2017-10-27 NOTE — Patient Instructions (Addendum)
Your headache is most likely due to your recent illness. You need to get hydrated.  Drink water and gatorade (or any other electrolyte drink). Be sure you are drinking at least 64 ounces a day. Drink enough fluids to keep your urine pale yellow.  You may take ibuprofen AND tylenol together for pain.  It may help to use a cool mist humidifier in your room at night while you sleep.   Thank you for coming in today. I hope you feel we met your needs.  Feel free to call PCP if you have any questions or further requests.  Please consider signing up for MyChart if you do not already have it, as this is a great way to communicate with me.  Best,  Whitney McVey, PA-C  IF you received an x-ray today, you will receive an invoice from [facility]. Please contact [facility] at [phone] with questions or concerns regarding your invoice.   IF you received labwork today, you will receive an invoice from [place]. Please contact [facility] at [phone] with questions or concerns regarding your invoice.   Our billing staff will not be able to assist you with questions regarding bills from these companies.  You will be contacted with the lab results as soon as they are available. The fastest way to get your results is to activate your My Chart account. Instructions are located on the last page of this paperwork. If you have not heard from [place] regarding the results in 2 weeks, please contact this office.

## 2017-10-27 NOTE — Progress Notes (Signed)
   Charles Hines  MRN: [id number] DOB: 05/30/1976  PCP: Patient, No Pcp Per  Subjective:  Pt is a 42 year old male who presents to clinic for headache x 3 days.   Four days ago headache and fever. No more fever. HA come and go.  He was sick last week with the flu. All his flu-like symptoms have resolved.  Taking ibuprofen. He is not drinking much water. Good appetite. Sleeping well. No visual changes, n/v.   ROS as below.   Review of Systems  Constitutional: Negative for chills, diaphoresis, fatigue and fever.  HENT: Negative for congestion, postnasal drip, rhinorrhea, sinus pressure, sinus pain, sneezing and sore throat.   Eyes: Negative for photophobia, pain, redness, itching and visual disturbance.  Respiratory: Negative for cough and shortness of breath.   Neurological: Positive for headaches. Negative for dizziness, weakness and light-headedness.  Psychiatric/Behavioral: Negative for sleep disturbance.    Patient Active Problem List   Diagnosis Date Noted  . Family history of coronary artery disease in mother 07/02/2017  . Dyslipidemia 07/02/2017  . Atypical chest pain 05/24/2017  . Ankle fracture, bimalleolar, closed 02/01/2016  . Closed bimalleolar fracture of left ankle 01/18/2016    No current outpatient medications on file prior to visit.   No current facility-administered medications on file prior to visit.     No Known Allergies   Objective:  BP 122/72   Pulse 70   Temp 98.6 F (37 C) (Oral)   Resp 17   Ht 5' 11.5" (1.816 m)   Wt 198 lb (89.8 kg)   SpO2 98%   BMI 27.23 kg/m   Physical Exam  Constitutional: He is oriented to person, place, and time and well-developed, well-nourished, and in no distress. No distress.  HENT:  Right Ear: Tympanic membrane normal.  Left Ear: Tympanic membrane normal.  Nose: Mucosal edema present. No rhinorrhea. Right sinus exhibits no maxillary sinus tenderness and no frontal sinus tenderness. Left sinus exhibits no  maxillary sinus tenderness and no frontal sinus tenderness.  Mouth/Throat: Oropharynx is clear and moist and mucous membranes are normal.  Eyes: Conjunctivae and EOM are normal. Pupils are equal, round, and reactive to light.  Cardiovascular: Normal rate, regular rhythm and normal heart sounds.  Pulmonary/Chest: Effort normal and breath sounds normal. No respiratory distress. He has no wheezes. He has no rales.  Neurological: He is alert and oriented to person, place, and time. GCS score is 15.  Skin: Skin is warm and dry.  Psychiatric: Mood, memory, affect and judgment normal.  Vitals reviewed.   Assessment and Plan :  1. Nonintractable episodic headache, unspecified headache type - pt presents c/o HA x 3 days s/p flu-like illness. All other symptoms have resolved. Suspect headache 2/2 dehydration. His vitals are stable, no red flag symptoms appreciated. Advised pt to stay well hydrated, ibuprofen/tylenol for pain. RTC if symptoms fail to improve.   Marco CollieWhitney Ahmaya Ostermiller, PA-C  Primary Care at [facility] 10/27/2017 10:17 AM

## 2017-12-09 ENCOUNTER — Encounter: Payer: Self-pay | Admitting: Physician Assistant

## 2018-03-20 ENCOUNTER — Encounter: Payer: Self-pay | Admitting: Physician Assistant

## 2018-03-24 ENCOUNTER — Other Ambulatory Visit: Payer: Self-pay

## 2018-03-24 ENCOUNTER — Ambulatory Visit: Payer: BLUE CROSS/BLUE SHIELD | Admitting: Physician Assistant

## 2018-03-24 ENCOUNTER — Encounter: Payer: Self-pay | Admitting: Physician Assistant

## 2018-03-24 VITALS — BP 138/84 | HR 54 | Temp 98.7°F | Resp 16 | Ht 71.0 in | Wt 203.6 lb

## 2018-03-24 DIAGNOSIS — L738 Other specified follicular disorders: Secondary | ICD-10-CM

## 2018-03-24 DIAGNOSIS — B999 Unspecified infectious disease: Secondary | ICD-10-CM

## 2018-03-24 MED ORDER — MUPIROCIN 2 % EX OINT: 1.0000 "application " | TOPICAL_OINTMENT | Freq: Three times a day (TID) | CUTANEOUS | 1 refills | Status: DC

## 2018-03-24 NOTE — Patient Instructions (Addendum)
I will contact you if you need to start oral antibiotics.  Apply Mupirocin ointment to affected areas 3 times daily.    Folliculitis Folliculitis is inflammation of the hair follicles. Folliculitis most commonly occurs on the scalp, thighs, legs, back, and buttocks. However, it can occur anywhere on the body. What are the causes? This condition may be caused by:  A bacterial infection (common).  A fungal infection.  A viral infection.  Coming into contact with certain chemicals, especially oils and tars.  Shaving or waxing.  Applying greasy ointments or creams to your skin often.  Long-lasting folliculitis and folliculitis that keeps coming back can be caused by bacteria that live in the nostrils. What increases the risk? This condition is more likely to develop in people with:  A weakened immune system.  Diabetes.  Obesity.  What are the signs or symptoms? Symptoms of this condition include:  Redness.  Soreness.  Swelling.  Itching.  Small white or yellow, pus-filled, itchy spots (pustules) that appear over a reddened area. If there is an infection that goes deep into the follicle, these may develop into a boil (furuncle).  A group of closely packed boils (carbuncle). These tend to form in hairy, sweaty areas of the body.  How is this diagnosed? This condition is diagnosed with a skin exam. To find what is causing the condition, your health care provider may take a sample of one of the pustules or boils for testing. How is this treated? This condition may be treated by:  Applying warm compresses to the affected areas.  Taking an antibiotic medicine or applying an antibiotic medicine to the skin.  Applying or bathing with an antiseptic solution.  Taking an over-the-counter medicine to help with itching.  Having a procedure to drain any pustules or boils. This may be done if a pustule or boil contains a lot of pus or fluid.  Laser hair removal. This may be  done to treat long-lasting folliculitis.  Follow these instructions at home:  If directed, apply heat to the affected area as often as told by your health care provider. Use the heat source that your health care provider recommends, such as a moist heat pack or a heating pad. ? Place a towel between your skin and the heat source. ? Leave the heat on for 20-30 minutes. ? Remove the heat if your skin turns bright red. This is especially important if you are unable to feel pain, heat, or cold. You may have a greater risk of getting burned.  If you were prescribed an antibiotic medicine, use it as told by your health care provider. Do not stop using the antibiotic even if you start to feel better.  Take over-the-counter and prescription medicines only as told by your health care provider.  Do not shave irritated skin.  Keep all follow-up visits as told by your health care provider. This is important. Get help right away if:  You have more redness, swelling, or pain in the affected area.  Red streaks are spreading from the affected area.  You have a fever. This information is not intended to replace advice given to you by your health care provider. Make sure you discuss any questions you have with your health care provider. Document Released: 10/27/2001 Document Revised: 03/07/2016 Document Reviewed: 06/08/2015 Elsevier Interactive Patient Education  2018 [employer].   IF you received an x-ray today, you will receive an invoice from [facility]. Please contact [facility] at [phone] with questions  or concerns regarding your invoice.   IF you received labwork today, you will receive an invoice from [facility]. Please contact [facility] at [phone] with questions or concerns regarding your invoice.   Our billing staff will not be able to assist you with questions regarding bills from these companies.  You will be contacted with the lab results as soon as they are  available. The fastest way to get your results is to activate your My Chart account. Instructions are located on the last page of this paperwork. If you have not heard from us regarding the results in 2 weeks, please contact this office.

## 2018-03-24 NOTE — Progress Notes (Signed)
   Charles Hines  MRN: [id number] DOB: 01/07/1976  PCP: Patient, No Pcp Per  Subjective:  Pt is a 42 year old male who presents to clinic for bumps on his chest and forehead x 2 weeks. Endorses itchiness.  He has not taken anything to feel better  Review of Systems  Constitutional: Negative for chills, diaphoresis, fatigue and fever.  Skin: Positive for rash.    Patient Active Problem List   Diagnosis Date Noted  . Family history of coronary artery disease in mother 07/02/2017  . Dyslipidemia 07/02/2017  . Atypical chest pain 05/24/2017  . Ankle fracture, bimalleolar, closed 02/01/2016  . Closed bimalleolar fracture of left ankle 01/18/2016    No current outpatient medications on file prior to visit.   No current facility-administered medications on file prior to visit.     No Known Allergies   Objective:  BP 138/84 (BP Location: Right Arm, Patient Position: Sitting, Cuff Size: Normal)   Pulse (!) 54   Temp 98.7 F (37.1 C) (Oral)   Resp 16   Ht 5\' 11"  (1.803 m)   Wt 203 lb 9.6 oz (92.4 kg)   SpO2 98%   BMI 28.40 kg/m   Physical Exam  Constitutional: He is oriented to person, place, and time. He appears well-developed and well-nourished.  Neurological: He is alert and oriented to person, place, and time.  Skin: Skin is warm and dry. Rash noted. Rash is papular and pustular.     Pustular nodule center of forehead.  Purulent drainage with deep palpation.  Wound culture taken.  About a half dozen less than half centimeter scattered papular lesions anterior central chest.  No drainage, erythema  Psychiatric: He has a normal mood and affect. His behavior is normal. Judgment and thought content normal.  Vitals reviewed.   Assessment and Plan :  1. Infectious folliculitis -Patient presents complaining of itchy rash.  Suspect infectious folliculitis, wound culture taken.  Plan to treat topically at this time.  If no improvement will switch to oral antibiotics. -  WOUND CULTURE - mupirocin ointment (BACTROBAN) 2 %; Apply 1 application topically 3 (three) times daily.  Dispense: 22 g; Refill: 1   Whitney Morelia Cassells, PA-C  Primary [facility] at [facility] 03/24/2018 8:45 AM

## 2018-03-27 LAB — WOUND CULTURE

## 2018-04-01 ENCOUNTER — Encounter: Payer: Self-pay | Admitting: Physician Assistant

## 2022-02-23 ENCOUNTER — Encounter (HOSPITAL_COMMUNITY): Payer: Self-pay | Admitting: Emergency Medicine

## 2022-02-23 ENCOUNTER — Ambulatory Visit (HOSPITAL_COMMUNITY)
Admission: EM | Admit: 2022-02-23 | Discharge: 2022-02-23 | Disposition: A | Discharge status: Home / Self Care | Payer: BLUE CROSS/BLUE SHIELD

## 2022-02-23 DIAGNOSIS — H109 Unspecified conjunctivitis: Secondary | ICD-10-CM

## 2022-02-23 MED ORDER — POLYMYXIN B-TRIMETHOPRIM 10000-0.1 UNIT/ML-% OP SOLN: 1.0000 [drp] | OPHTHALMIC | 0 refills | Status: AC

## 2022-02-23 NOTE — ED Triage Notes (Signed)
Pt reports having right eye redness, itching, watering and drainage for 2 days.
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
## 2022-02-24 ENCOUNTER — Ambulatory Visit (HOSPITAL_COMMUNITY)
Admission: RE | Admit: 2022-02-24 | Discharge: 2022-02-24 | Discharge status: Against Medical Advice | Payer: BC Managed Care – PPO | Source: Ambulatory Visit

## 2022-02-24 NOTE — ED Notes (Signed)
Patient had misunderstanding about follow up appointment from [facility] paperwork from where seen 6/25. Pt doesn't need to be seen today. Erin, PA aware and spoke with patient about continuing care and when he would need to follow up with urgent care or specialist.
